# Patient Record
Sex: Female | Born: 1935 | Race: White | Hispanic: No | Smoking: Former smoker
Health system: Southern US, Community
[De-identification: ages and names within clinical notes are randomized; demographics above are authoritative.]

## PROBLEM LIST (undated history)

## (undated) DIAGNOSIS — E7800 Pure hypercholesterolemia, unspecified: Secondary | ICD-10-CM

## (undated) DIAGNOSIS — E119 Type 2 diabetes mellitus without complications: Secondary | ICD-10-CM

## (undated) DIAGNOSIS — I10 Essential (primary) hypertension: Secondary | ICD-10-CM

## (undated) DIAGNOSIS — E079 Disorder of thyroid, unspecified: Secondary | ICD-10-CM

## (undated) DIAGNOSIS — G629 Polyneuropathy, unspecified: Secondary | ICD-10-CM

## (undated) DIAGNOSIS — F419 Anxiety disorder, unspecified: Secondary | ICD-10-CM

## (undated) DIAGNOSIS — C801 Malignant (primary) neoplasm, unspecified: Secondary | ICD-10-CM

## (undated) DIAGNOSIS — E039 Hypothyroidism, unspecified: Secondary | ICD-10-CM

## (undated) HISTORY — PX: APPENDECTOMY: SHX54

## (undated) HISTORY — PX: ABDOMINAL HYSTERECTOMY: SHX81

---

## 1998-11-09 ENCOUNTER — Other Ambulatory Visit: Admission: RE | Admit: 1998-11-09 | Discharge: 1998-11-09 | Payer: Self-pay | Admitting: *Deleted

## 1999-05-22 ENCOUNTER — Other Ambulatory Visit: Admission: RE | Admit: 1999-05-22 | Discharge: 1999-05-22 | Payer: Self-pay | Admitting: *Deleted

## 1999-12-19 ENCOUNTER — Other Ambulatory Visit: Admission: RE | Admit: 1999-12-19 | Discharge: 1999-12-19 | Payer: Self-pay | Admitting: *Deleted

## 1999-12-26 ENCOUNTER — Encounter: Admission: RE | Admit: 1999-12-26 | Discharge: 1999-12-26 | Payer: Self-pay | Admitting: *Deleted

## 2000-07-31 ENCOUNTER — Other Ambulatory Visit: Admission: RE | Admit: 2000-07-31 | Discharge: 2000-07-31 | Payer: Self-pay | Admitting: *Deleted

## 2001-01-06 ENCOUNTER — Encounter: Admission: RE | Admit: 2001-01-06 | Discharge: 2001-01-06 | Payer: Self-pay | Admitting: *Deleted

## 2001-02-25 ENCOUNTER — Other Ambulatory Visit: Admission: RE | Admit: 2001-02-25 | Discharge: 2001-02-25 | Payer: Self-pay | Admitting: *Deleted

## 2001-07-29 ENCOUNTER — Emergency Department (HOSPITAL_COMMUNITY): Admission: EM | Admit: 2001-07-29 | Discharge: 2001-07-29 | Payer: Self-pay | Admitting: Emergency Medicine

## 2001-08-21 ENCOUNTER — Other Ambulatory Visit: Admission: RE | Admit: 2001-08-21 | Discharge: 2001-08-21 | Payer: Self-pay | Admitting: *Deleted

## 2001-10-26 ENCOUNTER — Other Ambulatory Visit: Admission: RE | Admit: 2001-10-26 | Discharge: 2001-10-26 | Payer: Self-pay | Admitting: *Deleted

## 2002-01-19 ENCOUNTER — Encounter: Admission: RE | Admit: 2002-01-19 | Discharge: 2002-01-19 | Payer: Self-pay | Admitting: *Deleted

## 2002-02-26 ENCOUNTER — Other Ambulatory Visit: Admission: RE | Admit: 2002-02-26 | Discharge: 2002-02-26 | Payer: Self-pay | Admitting: *Deleted

## 2002-09-08 ENCOUNTER — Encounter (HOSPITAL_BASED_OUTPATIENT_CLINIC_OR_DEPARTMENT_OTHER): Admission: RE | Admit: 2002-09-08 | Discharge: 2002-12-07 | Payer: Self-pay | Admitting: Internal Medicine

## 2002-09-21 ENCOUNTER — Other Ambulatory Visit: Admission: RE | Admit: 2002-09-21 | Discharge: 2002-09-21 | Payer: Self-pay | Admitting: *Deleted

## 2003-01-27 ENCOUNTER — Encounter (HOSPITAL_BASED_OUTPATIENT_CLINIC_OR_DEPARTMENT_OTHER): Admission: RE | Admit: 2003-01-27 | Discharge: 2003-03-01 | Payer: Self-pay | Admitting: Internal Medicine

## 2003-04-29 ENCOUNTER — Encounter: Admission: RE | Admit: 2003-04-29 | Discharge: 2003-04-29 | Payer: Self-pay | Admitting: *Deleted

## 2003-04-29 ENCOUNTER — Other Ambulatory Visit: Admission: RE | Admit: 2003-04-29 | Discharge: 2003-04-29 | Payer: Self-pay | Admitting: *Deleted

## 2003-06-02 ENCOUNTER — Encounter (HOSPITAL_BASED_OUTPATIENT_CLINIC_OR_DEPARTMENT_OTHER): Admission: RE | Admit: 2003-06-02 | Discharge: 2003-06-21 | Payer: Self-pay | Admitting: Internal Medicine

## 2003-06-14 ENCOUNTER — Encounter: Admission: RE | Admit: 2003-06-14 | Discharge: 2003-09-12 | Payer: Self-pay | Admitting: *Deleted

## 2003-09-26 ENCOUNTER — Encounter (HOSPITAL_BASED_OUTPATIENT_CLINIC_OR_DEPARTMENT_OTHER): Admission: RE | Admit: 2003-09-26 | Discharge: 2003-09-30 | Payer: Self-pay | Admitting: Internal Medicine

## 2003-11-15 ENCOUNTER — Other Ambulatory Visit: Admission: RE | Admit: 2003-11-15 | Discharge: 2003-11-15 | Payer: Self-pay | Admitting: *Deleted

## 2003-12-20 ENCOUNTER — Encounter: Admission: RE | Admit: 2003-12-20 | Discharge: 2004-03-19 | Payer: Self-pay | Admitting: *Deleted

## 2003-12-21 ENCOUNTER — Encounter (HOSPITAL_BASED_OUTPATIENT_CLINIC_OR_DEPARTMENT_OTHER): Admission: RE | Admit: 2003-12-21 | Discharge: 2003-12-29 | Payer: Self-pay | Admitting: Internal Medicine

## 2004-03-29 ENCOUNTER — Encounter (HOSPITAL_BASED_OUTPATIENT_CLINIC_OR_DEPARTMENT_OTHER): Admission: RE | Admit: 2004-03-29 | Discharge: 2004-04-06 | Payer: Self-pay | Admitting: Internal Medicine

## 2004-06-04 ENCOUNTER — Ambulatory Visit: Payer: Self-pay | Admitting: Internal Medicine

## 2004-06-25 ENCOUNTER — Ambulatory Visit: Payer: Self-pay | Admitting: Internal Medicine

## 2004-07-03 ENCOUNTER — Encounter (HOSPITAL_BASED_OUTPATIENT_CLINIC_OR_DEPARTMENT_OTHER): Admission: RE | Admit: 2004-07-03 | Discharge: 2004-07-23 | Payer: Self-pay | Admitting: Internal Medicine

## 2004-10-19 ENCOUNTER — Encounter (HOSPITAL_BASED_OUTPATIENT_CLINIC_OR_DEPARTMENT_OTHER): Admission: RE | Admit: 2004-10-19 | Discharge: 2004-10-26 | Payer: Self-pay | Admitting: Internal Medicine

## 2005-06-27 ENCOUNTER — Ambulatory Visit: Payer: Self-pay | Admitting: Internal Medicine

## 2006-07-30 ENCOUNTER — Emergency Department: Payer: Self-pay | Admitting: Emergency Medicine

## 2006-08-14 ENCOUNTER — Ambulatory Visit: Payer: Self-pay | Admitting: Internal Medicine

## 2007-10-22 ENCOUNTER — Ambulatory Visit: Payer: Self-pay | Admitting: Internal Medicine

## 2008-10-27 ENCOUNTER — Ambulatory Visit: Payer: Self-pay | Admitting: Internal Medicine

## 2009-10-05 ENCOUNTER — Ambulatory Visit: Payer: Self-pay | Admitting: Gastroenterology

## 2009-10-08 ENCOUNTER — Emergency Department: Payer: Self-pay | Admitting: Emergency Medicine

## 2009-11-20 ENCOUNTER — Inpatient Hospital Stay: Payer: Self-pay | Admitting: Surgery

## 2010-01-31 ENCOUNTER — Ambulatory Visit: Payer: Self-pay | Admitting: Internal Medicine

## 2011-07-01 ENCOUNTER — Ambulatory Visit: Payer: Self-pay | Admitting: Internal Medicine

## 2011-09-02 ENCOUNTER — Ambulatory Visit: Payer: Self-pay | Admitting: Gastroenterology

## 2011-09-04 LAB — PATHOLOGY REPORT

## 2012-07-01 ENCOUNTER — Ambulatory Visit: Payer: Self-pay | Admitting: Internal Medicine

## 2012-07-07 ENCOUNTER — Emergency Department: Payer: Self-pay | Admitting: Emergency Medicine

## 2012-07-07 LAB — BASIC METABOLIC PANEL
Anion Gap: 8 (ref 7–16)
BUN: 21 mg/dL — ABNORMAL HIGH (ref 7–18)
Calcium, Total: 8.9 mg/dL (ref 8.5–10.1)
Chloride: 108 mmol/L — ABNORMAL HIGH (ref 98–107)
Co2: 25 mmol/L (ref 21–32)
Creatinine: 0.98 mg/dL (ref 0.60–1.30)
EGFR (African American): >60
EGFR (Non-African Amer.): 55 — ABNORMAL LOW
Glucose: 120 mg/dL — ABNORMAL HIGH (ref 65–99)
Osmolality: 285 (ref 275–301)
Potassium: 4 mmol/L (ref 3.5–5.1)
Sodium: 141 mmol/L (ref 136–145)

## 2012-07-07 LAB — CBC
HCT: 31 % — ABNORMAL LOW (ref 35.0–47.0)
HGB: 10.6 g/dL — ABNORMAL LOW (ref 12.0–16.0)
MCH: 30 pg (ref 26.0–34.0)
MCHC: 34.2 g/dL (ref 32.0–36.0)
MCV: 88 fL (ref 80–100)
Platelet: 199 x10 3/mm 3 (ref 150–440)
RBC: 3.53 X10 6/mm 3 — ABNORMAL LOW (ref 3.80–5.20)
RDW: 13.7 % (ref 11.5–14.5)
WBC: 7.7 x10 3/mm 3 (ref 3.6–11.0)

## 2013-07-02 ENCOUNTER — Ambulatory Visit: Payer: Self-pay | Admitting: Internal Medicine

## 2015-04-01 ENCOUNTER — Emergency Department: Payer: Medicare Other

## 2015-04-01 ENCOUNTER — Emergency Department
Admission: EM | Admit: 2015-04-01 | Discharge: 2015-04-01 | Disposition: A | Discharge status: Home / Self Care | Payer: Medicare Other | Attending: Emergency Medicine | Admitting: Emergency Medicine

## 2015-04-01 ENCOUNTER — Encounter: Payer: Self-pay | Admitting: Emergency Medicine

## 2015-04-01 DIAGNOSIS — S93401A Sprain of unspecified ligament of right ankle, initial encounter: Secondary | ICD-10-CM | POA: Diagnosis not present

## 2015-04-01 DIAGNOSIS — Y9301 Activity, walking, marching and hiking: Secondary | ICD-10-CM | POA: Insufficient documentation

## 2015-04-01 DIAGNOSIS — W01198A Fall on same level from slipping, tripping and stumbling with subsequent striking against other object, initial encounter: Secondary | ICD-10-CM | POA: Diagnosis not present

## 2015-04-01 DIAGNOSIS — S0003XA Contusion of scalp, initial encounter: Secondary | ICD-10-CM | POA: Diagnosis not present

## 2015-04-01 DIAGNOSIS — Y92009 Unspecified place in unspecified non-institutional (private) residence as the place of occurrence of the external cause: Secondary | ICD-10-CM | POA: Insufficient documentation

## 2015-04-01 DIAGNOSIS — I10 Essential (primary) hypertension: Secondary | ICD-10-CM | POA: Diagnosis not present

## 2015-04-01 DIAGNOSIS — Y998 Other external cause status: Secondary | ICD-10-CM | POA: Diagnosis not present

## 2015-04-01 DIAGNOSIS — S99911A Unspecified injury of right ankle, initial encounter: Secondary | ICD-10-CM | POA: Diagnosis present

## 2015-04-01 HISTORY — DX: Disorder of thyroid, unspecified: E07.9

## 2015-04-01 HISTORY — DX: Essential (primary) hypertension: I10

## 2015-04-01 HISTORY — DX: Pure hypercholesterolemia, unspecified: E78.00

## 2015-04-01 HISTORY — DX: Anxiety disorder, unspecified: F41.9

## 2015-04-01 HISTORY — DX: Polyneuropathy, unspecified: G62.9

## 2015-04-01 NOTE — ED Notes (Signed)
Pt's son reports pt tripped over doorway bottom and slid in socks on wooden floor. Pt reports right ankle pain to right side of ankle and top of foot.

## 2015-04-01 NOTE — Discharge Instructions (Signed)
Ankle Sprain An ankle sprain is an injury to the strong, fibrous tissues (ligaments) that hold your ankle bones together.  HOME CARE   Put ice on your ankle for 1-2 days or as told by your doctor.  Put ice in a plastic bag.  Place a towel between your skin and the bag.  Leave the ice on for 15-20 minutes at a time, every 2 hours while you are awake.  Only take medicine as told by your doctor.  Raise (elevate) your injured ankle above the level of your heart as much as possible for 2-3 days.  Use crutches if your doctor tells you to. Slowly put your own weight on the affected ankle. Use the crutches until you can walk without pain.  If you have a plaster splint:  Do not rest it on anything harder than a pillow for 24 hours.  Do not put weight on it.  Do not get it wet.  Take it off to shower or bathe.  If given, use an elastic wrap or support stocking for support. Take the wrap off if your toes lose feeling (numb), tingle, or turn cold or blue.  If you have an air splint:  Add or let out air to make it comfortable.  Take it off at night and to shower and bathe.  Wiggle your toes and move your ankle up and down often while you are wearing it. GET HELP IF:  You have rapidly increasing bruising or puffiness (swelling).  Your toes feel very cold.  You lose feeling in your foot.  Your medicine does not help your pain. GET HELP RIGHT AWAY IF:   Your toes lose feeling (numb) or turn blue.  You have severe pain that is increasing. MAKE SURE YOU:   Understand these instructions.  Will watch your condition.  Will get help right away if you are not doing well or get worse. Document Released: 01/22/2008 Document Revised: 12/20/2013 Document Reviewed: 02/17/2012 [facility] Patient Information 2015 [place], [place]. This information is not intended to replace advice given to you by your health care provider. Make sure you discuss any questions you have with your health care  provider.   WEAR ANKLE SPLINT FOR SUPPORT, ICE AND ELEVATE TYLENOL IF NEEDED FOR PAIN FOLLOW UP WITH DR. Caryl Comes  RETURN TO ER IF ANY URGENT CONCERNS OR SEVERE WORSENING

## 2015-04-01 NOTE — ED Provider Notes (Signed)
[facility] Emergency Department Provider Note  ____________________________________________  Time seen: Approximately 6:17 PM  I have reviewed the triage vital signs and the nursing notes.   HISTORY  Chief Complaint Ankle Pain   HPI Autumn Scott is a 81 y.o. female is here with  complaint of right ankle/foot pain since this morning. She states that she slipped in her sock feet on the floor between the bathroom and kitchen. She states she landed on her bottom but she believes she also hit her head. She denies any loss of consciousness however she lives alone and this was not witnessed. She denies any nausea vomiting, no changes in vision, and family deny altered mental status. Family states that when they arrived she was walking slowly with a limp. She denies any previous injuries to her right ankle. She has not taken any over-the-counter medication for this.   Past Medical History  Diagnosis Date  . Hypertension   . Thyroid disease   . Anxiety   . Neuropathy   . High cholesterol     There are no active problems to display for this patient.   Past Surgical History  Procedure Laterality Date  . Appendectomy    . Abdominal hysterectomy      No current outpatient prescriptions on file.  Allergies Review of patient's allergies indicates no known allergies.  No family history on file.  Social History Social History  Substance Use Topics  . Smoking status: Never Smoker   . Smokeless tobacco: None  . Alcohol Use: No    Review of Systems Constitutional: No fever/chills Eyes: No visual changes. Cardiovascular: Denies chest pain. Respiratory: Denies shortness of breath. Gastrointestinal: No abdominal pain.  No nausea, no vomiting.  Genitourinary: Negative for dysuria. Musculoskeletal: Negative for back pain. Positive for ankle pain Skin: Negative for rash. Neurological: Negative for headaches, focal weakness or numbness.  10-point ROS  otherwise negative.  ____________________________________________   PHYSICAL EXAM:  VITAL SIGNS: ED Triage Vitals  Enc Vitals Group     BP 04/01/15 1647 106/74 mmHg     Pulse Rate 04/01/15 1647 71     Resp 04/01/15 1647 16     Temp 04/01/15 1647 98.3 F (36.8 C)     Temp Source 04/01/15 1647 Oral     SpO2 04/01/15 1647 97 %     Weight 04/01/15 1647 130 lb (58.968 kg)     Height 04/01/15 1647 5\' 3"  (1.6 m)     Head Cir --      Peak Flow --      Pain Score 04/01/15 1648 5     Pain Loc --      Pain Edu? --      Excl. in [place]? --     Constitutional: Alert and oriented. Well appearing and in no acute distress. Eyes: Conjunctivae are normal. PERRL. EOMI. Head: There is some tenderness on palpation of scalp right posterior without evidence of any edema or hematoma. Nose: No congestion/rhinnorhea. Neck: No stridor.  Nontender cervical spine to palpation. Cardiovascular: Normal rate, regular rhythm. Grossly normal heart sounds.  Good peripheral circulation. Respiratory: Normal respiratory effort.  No retractions. Lungs CTAB. Gastrointestinal: Soft and nontender. No distention. Musculoskeletal: Right ankle moderate tenderness lateral aspect with some soft tissue swelling. There is no gross deformity noted. Range of motion is slightly restricted secondary to pain. Patient is able to bear weight. No lower extremity tenderness nor edema.  No joint effusions. Neurologic:  Normal speech and language. No  gross focal neurologic deficits are appreciated. No gait instability. Skin:  Skin is warm, dry and intact. No rash noted. No ecchymosis or abrasions were noted Psychiatric: Mood and affect are normal. Speech and behavior are normal.  ____________________________________________   LABS (all labs ordered are listed, but only abnormal results are displayed)  Labs Reviewed - No data to display   RADIOLOGY  Right ankle x-ray per radiologist and reviewed by me showed no acute fracture. Mild  soft tissue swelling was seen. I, Johnn Hai, personally viewed and evaluated these images as part of my medical decision making.    CT per radiologist showed no acute intracranial abnormality ____________________________________________   PROCEDURES  Procedure(s) performed: None  Critical Care performed: No  ____________________________________________   INITIAL IMPRESSION / ASSESSMENT AND PLAN / ED COURSE  Pertinent labs & imaging results that were available during my care of the patient were reviewed by me and considered in my medical decision making (see chart for details  patient was placed in a stirrup ankle splint to see if this is her support with walking. Patient were made with Tylenol for pain if needed. It was discussed that other narcotics couldn't cause drowsiness increase her risk of falling. Family members were making arrangements for someone to stay with her. Patient is to follow-up with her PCP if any continued problems. She will ice and elevate as needed for swelling and pain. She'll also return to the emergency room if any severe worsening or urgent concerns.   FINAL CLINICAL IMPRESSION(S) / ED DIAGNOSES  Final diagnoses:  Sprain of right ankle, initial encounter  Contusion of scalp, initial encounter      Johnn Hai, PA-C 04/01/15 2031  Orbie Pyo, MD 04/02/15 [phone]

## 2015-04-01 NOTE — ED Notes (Signed)
Pt to ed with c/o right ankle/foot pain and hit her head when she fell at home. Pt lives alone and called family, daughter states she was still walking on her foot when it happened. Pt states she was getting out of the bathroom and slipped and landed on her bottom

## 2015-07-06 ENCOUNTER — Encounter: Payer: Self-pay | Admitting: Emergency Medicine

## 2015-07-06 ENCOUNTER — Emergency Department: Payer: Medicare Other

## 2015-07-06 ENCOUNTER — Emergency Department
Admission: EM | Admit: 2015-07-06 | Discharge: 2015-07-06 | Disposition: A | Discharge status: Home / Self Care | Payer: Medicare Other | Attending: Student | Admitting: Student

## 2015-07-06 DIAGNOSIS — R001 Bradycardia, unspecified: Secondary | ICD-10-CM

## 2015-07-06 DIAGNOSIS — E860 Dehydration: Secondary | ICD-10-CM | POA: Insufficient documentation

## 2015-07-06 DIAGNOSIS — R42 Dizziness and giddiness: Secondary | ICD-10-CM | POA: Diagnosis present

## 2015-07-06 DIAGNOSIS — Z79899 Other long term (current) drug therapy: Secondary | ICD-10-CM | POA: Diagnosis not present

## 2015-07-06 DIAGNOSIS — N390 Urinary tract infection, site not specified: Secondary | ICD-10-CM | POA: Diagnosis not present

## 2015-07-06 DIAGNOSIS — I10 Essential (primary) hypertension: Secondary | ICD-10-CM | POA: Diagnosis not present

## 2015-07-06 LAB — URINALYSIS COMPLETE WITH MICROSCOPIC (ARMC ONLY)
BACTERIA UA: NONE SEEN
Bilirubin Urine: NEGATIVE
Glucose, UA: NEGATIVE mg/dL
Hgb urine dipstick: NEGATIVE
Nitrite: NEGATIVE
PH: 5 (ref 5.0–8.0)
PROTEIN: NEGATIVE mg/dL
Specific Gravity, Urine: 1.021 (ref 1.005–1.030)

## 2015-07-06 LAB — CBC WITH DIFFERENTIAL/PLATELET
BASOS PCT: 1 %
Basophils Absolute: 0.1 10*3/uL (ref 0–0.1)
EOS PCT: 2 %
Eosinophils Absolute: 0.1 10*3/uL (ref 0–0.7)
HEMATOCRIT: 36.6 % (ref 35.0–47.0)
Hemoglobin: 12.4 g/dL (ref 12.0–16.0)
Lymphocytes Relative: 21 %
Lymphs Abs: 1.2 10*3/uL (ref 1.0–3.6)
MCH: 29.3 pg (ref 26.0–34.0)
MCHC: 33.8 g/dL (ref 32.0–36.0)
MCV: 86.8 fL (ref 80.0–100.0)
MONO ABS: 0.5 10*3/uL (ref 0.2–0.9)
MONOS PCT: 9 %
NEUTROS ABS: 3.7 10*3/uL (ref 1.4–6.5)
Neutrophils Relative %: 67 %
PLATELETS: 226 10*3/uL (ref 150–440)
RBC: 4.22 MIL/uL (ref 3.80–5.20)
RDW: 13.9 % (ref 11.5–14.5)
WBC: 5.5 10*3/uL (ref 3.6–11.0)

## 2015-07-06 LAB — TROPONIN I

## 2015-07-06 LAB — BASIC METABOLIC PANEL
ANION GAP: 8 (ref 5–15)
BUN: 27 mg/dL — ABNORMAL HIGH (ref 6–20)
CALCIUM: 9.9 mg/dL (ref 8.9–10.3)
CO2: 26 mmol/L (ref 22–32)
Chloride: 103 mmol/L (ref 101–111)
Creatinine, Ser: 1.21 mg/dL — ABNORMAL HIGH (ref 0.44–1.00)
GFR, EST AFRICAN AMERICAN: 47 mL/min — AB (ref >60)
GFR, EST NON AFRICAN AMERICAN: 41 mL/min — AB (ref >60)
Glucose, Bld: 105 mg/dL — ABNORMAL HIGH (ref 65–99)
Potassium: 3.9 mmol/L (ref 3.5–5.1)
SODIUM: 137 mmol/L (ref 135–145)

## 2015-07-06 MED ORDER — SODIUM CHLORIDE 0.9 % IV BOLUS (SEPSIS)
500.0000 mL | Freq: Once | INTRAVENOUS | Status: AC
  Administered 2015-07-06: 500 mL via INTRAVENOUS

## 2015-07-06 MED ORDER — CEPHALEXIN 500 MG PO CAPS: 500.0000 mg | ORAL_CAPSULE | Freq: Four times a day (QID) | ORAL | Status: AC

## 2015-07-06 MED ORDER — DEXTROSE 5 % IV SOLN
1.0000 g | Freq: Once | INTRAVENOUS | Status: AC
  Administered 2015-07-06: 1 g via INTRAVENOUS
  Filled 2015-07-06: qty 10

## 2015-07-06 NOTE — ED Notes (Signed)
Lives alone , states has been in bed all day, c/o some nausea and some nausea, yesterday was her normal, denies gait problems,denies headache, she drove to her friends house to get her to bring her to the er, friend states she sees no change in pt

## 2015-07-06 NOTE — ED Provider Notes (Signed)
-----------------------------------------   5:02 PM on 07/06/2015 -----------------------------------------  Care was assumed from Dr. Burlene Arnt pending reassessment. Patient reports she feels well and is requesting discharge. Heart rate in the 60s, blood pressure with a systolic of 0000000. She is ambulating without any difficulty, lightheadedness, dizziness. I discussed with her return precautions, need for close follow-up tomorrow with Dr. Humphrey Rolls and she is comfortable with discharge.  Joanne Gavel, MD 07/06/15 [phone]

## 2015-07-06 NOTE — ED Notes (Signed)
MD at bedside. 

## 2015-07-06 NOTE — ED Notes (Signed)
Pt ambulated about 50 feet. Pt denied dizziness during walk. Pt was steady on her feet.  Pt reports feeling comfortable with discharge

## 2015-07-06 NOTE — ED Notes (Signed)
Patient transported to CT 

## 2015-07-06 NOTE — ED Notes (Signed)
Pt to ed with c/o dizziness that started about 0830.  Pt also reports weakness all over that started about the same time. Pt also reports some mild confusion this am.

## 2015-07-06 NOTE — ED Provider Notes (Addendum)
[facility] Emergency Department Provider Note  ____________________________________________   I have reviewed the triage vital signs and the nursing notes.   HISTORY  Chief Complaint Dizziness    HPI Autumn Scott is a 81 y.o. female presents today after having felt lightheaded earlier today. Patient states that she was in her normal state of health and she was lying down to sit up in her eye and felt lightheaded. Apparently saw another provider she had a headache, she denied this to me. She states she just felt lightheaded for little while walked to her neighbor's house and by the time she got there she felt fine. She did not have any true vertigo. She had no focal numbness or weakness. She did not pass out. She'll feel that she was going to pass out. She had no abdominal pain no rectal bleeding no melena no vomiting no chest pain no headache, at this time she complains of nothing at all.  Past Medical History  Diagnosis Date  . Hypertension   . Thyroid disease   . Anxiety   . Neuropathy ([place])   . High cholesterol     There are no active problems to display for this patient.   Past Surgical History  Procedure Laterality Date  . Appendectomy    . Abdominal hysterectomy      Current Outpatient Rx  Name  Route  Sig  Dispense  Refill  . gabapentin (NEURONTIN) 600 MG tablet   Oral   Take 600 mg by mouth every 6 (six) hours.         Marland Kitchen levothyroxine (SYNTHROID, LEVOTHROID) 100 MCG tablet   Oral   Take 100 mcg by mouth daily before breakfast.         . lisinopril (PRINIVIL,ZESTRIL) 10 MG tablet   Oral   Take 10 mg by mouth daily.         . simvastatin (ZOCOR) 40 MG tablet   Oral   Take 40 mg by mouth daily at 6 PM.           Allergies Review of patient's allergies indicates no known allergies.  History reviewed. No pertinent family history.  Social History Social History  Substance Use Topics  . Smoking status: Never Smoker    . Smokeless tobacco: None  . Alcohol Use: No    Review of Systems Constitutional: No fever/chills Eyes: No visual changes. ENT: No sore throat. No stiff neck no neck pain Cardiovascular: Denies chest pain. Respiratory: Denies shortness of breath. Gastrointestinal:   no vomiting.  No diarrhea.  No constipation. Genitourinary: Negative for dysuria. Musculoskeletal: Negative lower extremity swelling Skin: Negative for rash. Neurological: Negative for headaches, focal weakness or numbness. 10-point ROS otherwise negative.  ____________________________________________   PHYSICAL EXAM:  VITAL SIGNS: ED Triage Vitals  Enc Vitals Group     BP 07/06/15 1304 121/62 mmHg     Pulse Rate 07/06/15 1304 69     Resp 07/06/15 1304 20     Temp 07/06/15 1304 97.4 F (36.3 C)     Temp Source 07/06/15 1304 Oral     SpO2 07/06/15 1304 96 %     Weight 07/06/15 1304 130 lb (58.968 kg)     Height 07/06/15 1304 5\' 5"  (1.651 m)     Head Cir --      Peak Flow --      Pain Score 07/06/15 1305 0     Pain Loc --      Pain Edu? --  Excl. in [place]? --     Constitutional: Alert and oriented. Well appearing and in no acute distress. Eyes: Conjunctivae are normal. PERRL. EOMI. Head: Atraumatic. Nose: No congestion/rhinnorhea. Mouth/Throat: Mucous membranes are moist.  Oropharynx non-erythematous. Neck: No stridor.   Nontender with no meningismus Cardiovascular: Normal rate, regular rhythm. Grossly normal heart sounds.  Good peripheral circulation. Respiratory: Normal respiratory effort.  No retractions. Lungs CTAB. Abdominal: Soft and nontender. No distention. No guarding no rebound Back:  There is no focal tenderness or step off there is no midline tenderness there are no lesions noted. there is no CVA tenderness Musculoskeletal: No lower extremity tenderness. No joint effusions, no DVT signs strong distal pulses no edema Neurologic:  Cranial nerves II through XII are grossly intact 5 out of 5  strength bilateral upper and lower extremity. Finger to nose within normal limits heel to shin within normal limits, speech is normal with no word finding difficulty ordered dysarthria, reflexes symmetric pupils are equally round and reactive to light there is no pronator drift, sensation is normal, normal neurologic exam Skin:  Skin is warm, dry and intact. No rash noted. Psychiatric: Mood and affect are normal. Speech and behavior are normal.  ____________________________________________   LABS (all labs ordered are listed, but only abnormal results are displayed)  Labs Reviewed  BASIC METABOLIC PANEL - Abnormal; Notable for the following:    Glucose, Bld 105 (*)    BUN 27 (*)    Creatinine, Ser 1.21 (*)    GFR calc non Af Amer 41 (*)    GFR calc Af Amer 47 (*)    All other components within normal limits  CBC WITH DIFFERENTIAL/PLATELET  TROPONIN I  URINALYSIS COMPLETEWITH MICROSCOPIC ([facility] ONLY)   ____________________________________________  EKG  I personally interpreted any EKGs ordered by me or triage Sinus bradycardia rate 55 bpm no acute ST elevation or depression poor R-wave progression noted, no acute ischemic changes ____________________________________________  RADIOLOGY  I reviewed any imaging ordered by me or triage that were performed during my shift ____________________________________________   PROCEDURES  Procedure(s) performed: None  Critical Care performed: None  ____________________________________________   INITIAL IMPRESSION / ASSESSMENT AND PLAN / ED COURSE  Pertinent labs & imaging results that were available during my care of the patient were reviewed by me and considered in my medical decision making (see chart for details).  Very well-appearing woman here with no complaints, states that she was lightheaded when standing up rapidly at home. Mild bradycardia noted but not dramatic in terms of pressure. Even her H Willis to a large workup but  if negative I hope that we can get her safely home. This time we are waiting for her urinalysis.   ----------------------------------------- 3:47 PM on 07/06/2015 -----------------------------------------  UA as noted, patient has no dysuria no fever we'll start her on antibiotics cultures. This could make her lightheaded.. She does appear mildly dehydrated by her blood work within slightly elevated BUN/creatinine ratio which we'll giving her IV fluid 4. Patient is not on any beta blocker blood pressure medication and her heart rate is in the 50s muscle time and sometimes goes down as low as 48. She is not orthostatic in terms of her response to standing up from a lying position but her blood pressure does drop some. I will discuss with cardiology her bradycardia, there is certainly no evidence of ischemia and her symptoms began this morning. Patient is adamant that she would prefer to go home, we will give her a  liter of fluid and recheck her orthostatics and she remains a symptomatically this and cardiology can follow up in short order we may be able to arrange that.  D/w Dr. Humphrey Rolls, cardiology, and he agrees that discharge with this heart rate is not a problem he will see her first thing tomorrow morning at 9. We will see what her orthostatics are after IV fluid and reassess. Dr. Edd Fabian to f/u.   ____________________________________________   FINAL CLINICAL IMPRESSION(S) / ED DIAGNOSES  Final diagnoses:  None     Schuyler Amor, MD 07/06/15 [place], MD 07/06/15 [place], MD 07/06/15 [place], MD 07/06/15 [place], MD 07/06/15 [phone]

## 2015-07-08 LAB — URINE CULTURE

## 2016-02-14 ENCOUNTER — Other Ambulatory Visit: Payer: Self-pay | Admitting: Internal Medicine

## 2016-02-14 DIAGNOSIS — R413 Other amnesia: Secondary | ICD-10-CM

## 2016-02-22 ENCOUNTER — Ambulatory Visit: Payer: Medicare Other | Attending: Internal Medicine

## 2016-08-26 ENCOUNTER — Emergency Department: Payer: Medicare Other

## 2016-08-26 ENCOUNTER — Inpatient Hospital Stay
Admission: EM | Admit: 2016-08-26 | Discharge: 2016-08-29 | DRG: 956 | Disposition: A | Discharge status: Skilled Nursing Facility | Payer: Medicare Other | Attending: Internal Medicine | Admitting: Internal Medicine

## 2016-08-26 DIAGNOSIS — Y92009 Unspecified place in unspecified non-institutional (private) residence as the place of occurrence of the external cause: Secondary | ICD-10-CM | POA: Diagnosis not present

## 2016-08-26 DIAGNOSIS — E039 Hypothyroidism, unspecified: Secondary | ICD-10-CM | POA: Diagnosis present

## 2016-08-26 DIAGNOSIS — I129 Hypertensive chronic kidney disease with stage 1 through stage 4 chronic kidney disease, or unspecified chronic kidney disease: Secondary | ICD-10-CM | POA: Diagnosis present

## 2016-08-26 DIAGNOSIS — Z90710 Acquired absence of both cervix and uterus: Secondary | ICD-10-CM | POA: Diagnosis not present

## 2016-08-26 DIAGNOSIS — M25551 Pain in right hip: Secondary | ICD-10-CM

## 2016-08-26 DIAGNOSIS — F419 Anxiety disorder, unspecified: Secondary | ICD-10-CM | POA: Diagnosis present

## 2016-08-26 DIAGNOSIS — W1830XA Fall on same level, unspecified, initial encounter: Secondary | ICD-10-CM | POA: Diagnosis present

## 2016-08-26 DIAGNOSIS — F0390 Unspecified dementia without behavioral disturbance: Secondary | ICD-10-CM | POA: Diagnosis present

## 2016-08-26 DIAGNOSIS — Z833 Family history of diabetes mellitus: Secondary | ICD-10-CM

## 2016-08-26 DIAGNOSIS — S72141A Displaced intertrochanteric fracture of right femur, initial encounter for closed fracture: Secondary | ICD-10-CM | POA: Diagnosis present

## 2016-08-26 DIAGNOSIS — M6281 Muscle weakness (generalized): Secondary | ICD-10-CM

## 2016-08-26 DIAGNOSIS — Z79899 Other long term (current) drug therapy: Secondary | ICD-10-CM

## 2016-08-26 DIAGNOSIS — S32591A Other specified fracture of right pubis, initial encounter for closed fracture: Secondary | ICD-10-CM | POA: Diagnosis present

## 2016-08-26 DIAGNOSIS — Z8249 Family history of ischemic heart disease and other diseases of the circulatory system: Secondary | ICD-10-CM | POA: Diagnosis not present

## 2016-08-26 DIAGNOSIS — E7800 Pure hypercholesterolemia, unspecified: Secondary | ICD-10-CM | POA: Diagnosis present

## 2016-08-26 DIAGNOSIS — E1122 Type 2 diabetes mellitus with diabetic chronic kidney disease: Secondary | ICD-10-CM | POA: Diagnosis present

## 2016-08-26 DIAGNOSIS — Z808 Family history of malignant neoplasm of other organs or systems: Secondary | ICD-10-CM | POA: Diagnosis not present

## 2016-08-26 DIAGNOSIS — Z419 Encounter for procedure for purposes other than remedying health state, unspecified: Secondary | ICD-10-CM

## 2016-08-26 DIAGNOSIS — N189 Chronic kidney disease, unspecified: Secondary | ICD-10-CM | POA: Diagnosis present

## 2016-08-26 DIAGNOSIS — N390 Urinary tract infection, site not specified: Secondary | ICD-10-CM | POA: Diagnosis present

## 2016-08-26 DIAGNOSIS — E785 Hyperlipidemia, unspecified: Secondary | ICD-10-CM | POA: Diagnosis present

## 2016-08-26 DIAGNOSIS — N179 Acute kidney failure, unspecified: Secondary | ICD-10-CM | POA: Diagnosis present

## 2016-08-26 DIAGNOSIS — R262 Difficulty in walking, not elsewhere classified: Secondary | ICD-10-CM

## 2016-08-26 DIAGNOSIS — Z8781 Personal history of (healed) traumatic fracture: Secondary | ICD-10-CM

## 2016-08-26 DIAGNOSIS — Z9889 Other specified postprocedural states: Secondary | ICD-10-CM

## 2016-08-26 HISTORY — DX: Malignant (primary) neoplasm, unspecified: C80.1

## 2016-08-26 HISTORY — DX: Hypothyroidism, unspecified: E03.9

## 2016-08-26 LAB — GLUCOSE, CAPILLARY
GLUCOSE-CAPILLARY: 303 mg/dL — AB (ref 65–99)
Glucose-Capillary: 374 mg/dL — ABNORMAL HIGH (ref 65–99)

## 2016-08-26 LAB — CBC WITH DIFFERENTIAL/PLATELET
BASOS ABS: 0.1 10*3/uL (ref 0–0.1)
Basophils Relative: 1 %
Eosinophils Absolute: 0.2 10*3/uL (ref 0–0.7)
Eosinophils Relative: 2 %
HEMATOCRIT: 33.8 % — AB (ref 35.0–47.0)
Hemoglobin: 11.6 g/dL — ABNORMAL LOW (ref 12.0–16.0)
LYMPHS PCT: 6 %
Lymphs Abs: 0.6 10*3/uL — ABNORMAL LOW (ref 1.0–3.6)
MCH: 30.2 pg (ref 26.0–34.0)
MCHC: 34.4 g/dL (ref 32.0–36.0)
MCV: 88 fL (ref 80.0–100.0)
MONO ABS: 0.9 10*3/uL (ref 0.2–0.9)
MONOS PCT: 8 %
NEUTROS ABS: 9 10*3/uL — AB (ref 1.4–6.5)
Neutrophils Relative %: 83 %
Platelets: 283 10*3/uL (ref 150–440)
RBC: 3.84 MIL/uL (ref 3.80–5.20)
RDW: 15.1 % — AB (ref 11.5–14.5)
WBC: 10.8 10*3/uL (ref 3.6–11.0)

## 2016-08-26 LAB — COMPREHENSIVE METABOLIC PANEL
ALT: 22 U/L (ref 14–54)
AST: 27 U/L (ref 15–41)
Albumin: 4 g/dL (ref 3.5–5.0)
Alkaline Phosphatase: 51 U/L (ref 38–126)
Anion gap: 7 (ref 5–15)
BILIRUBIN TOTAL: 1 mg/dL (ref 0.3–1.2)
BUN: 43 mg/dL — AB (ref 6–20)
CO2: 29 mmol/L (ref 22–32)
CREATININE: 1.25 mg/dL — AB (ref 0.44–1.00)
Calcium: 10.1 mg/dL (ref 8.9–10.3)
Chloride: 102 mmol/L (ref 101–111)
GFR calc Af Amer: 45 mL/min — ABNORMAL LOW (ref >60)
GFR, EST NON AFRICAN AMERICAN: 39 mL/min — AB (ref >60)
Glucose, Bld: 433 mg/dL — ABNORMAL HIGH (ref 65–99)
Potassium: 3.5 mmol/L (ref 3.5–5.1)
Sodium: 138 mmol/L (ref 135–145)
TOTAL PROTEIN: 7.8 g/dL (ref 6.5–8.1)

## 2016-08-26 MED ORDER — OXYCODONE HCL 5 MG PO TABS: 5.0000 mg | ORAL_TABLET | ORAL | Status: DC | PRN

## 2016-08-26 MED ORDER — HYDROCODONE-ACETAMINOPHEN 5-325 MG PO TABS: 1.0000 | ORAL_TABLET | Freq: Four times a day (QID) | ORAL | Status: DC | PRN

## 2016-08-26 MED ORDER — LEVOTHYROXINE SODIUM 100 MCG PO TABS
100.0000 ug | ORAL_TABLET | Freq: Every day | ORAL | Status: DC
  Administered 2016-08-28 – 2016-08-29 (×2): 100 ug via ORAL
  Filled 2016-08-26 (×2): qty 1

## 2016-08-26 MED ORDER — DEXTROSE 5 % IV SOLN
500.0000 mg | Freq: Four times a day (QID) | INTRAVENOUS | Status: DC | PRN
  Filled 2016-08-26: qty 5

## 2016-08-26 MED ORDER — MORPHINE SULFATE (PF) 2 MG/ML IV SOLN: 0.5000 mg | INTRAVENOUS | Status: DC | PRN

## 2016-08-26 MED ORDER — BISACODYL 5 MG PO TBEC: 5.0000 mg | DELAYED_RELEASE_TABLET | Freq: Every day | ORAL | Status: DC | PRN

## 2016-08-26 MED ORDER — METHOCARBAMOL 500 MG PO TABS
500.0000 mg | ORAL_TABLET | Freq: Four times a day (QID) | ORAL | Status: DC | PRN
  Filled 2016-08-26: qty 1

## 2016-08-26 MED ORDER — LISINOPRIL 10 MG PO TABS
10.0000 mg | ORAL_TABLET | Freq: Every day | ORAL | Status: DC
  Administered 2016-08-28 – 2016-08-29 (×2): 10 mg via ORAL
  Filled 2016-08-26 (×2): qty 1

## 2016-08-26 MED ORDER — SENNOSIDES-DOCUSATE SODIUM 8.6-50 MG PO TABS: 1.0000 | ORAL_TABLET | Freq: Every evening | ORAL | Status: DC | PRN

## 2016-08-26 MED ORDER — MAGNESIUM CITRATE PO SOLN
1.0000 | Freq: Once | ORAL | Status: DC | PRN
  Filled 2016-08-26: qty 296

## 2016-08-26 MED ORDER — GABAPENTIN 600 MG PO TABS
600.0000 mg | ORAL_TABLET | Freq: Four times a day (QID) | ORAL | Status: DC
  Administered 2016-08-27 – 2016-08-29 (×5): 600 mg via ORAL
  Filled 2016-08-26 (×6): qty 1

## 2016-08-26 MED ORDER — MORPHINE SULFATE (PF) 2 MG/ML IV SOLN: 2.0000 mg | INTRAVENOUS | Status: DC | PRN

## 2016-08-26 MED ORDER — HEPARIN SODIUM (PORCINE) 5000 UNIT/ML IJ SOLN: 5000.0000 [IU] | Freq: Three times a day (TID) | INTRAMUSCULAR | Status: DC

## 2016-08-26 MED ORDER — CEFAZOLIN SODIUM-DEXTROSE 2-4 GM/100ML-% IV SOLN
2.0000 g | INTRAVENOUS | Status: AC
  Administered 2016-08-27: 2 g via INTRAVENOUS
  Filled 2016-08-26: qty 100

## 2016-08-26 MED ORDER — SIMVASTATIN 20 MG PO TABS
40.0000 mg | ORAL_TABLET | Freq: Every day | ORAL | Status: DC
  Administered 2016-08-27 – 2016-08-28 (×2): 40 mg via ORAL
  Filled 2016-08-26 (×2): qty 2

## 2016-08-26 MED ORDER — INSULIN ASPART 100 UNIT/ML ~~LOC~~ SOLN
0.0000 [IU] | Freq: Every day | SUBCUTANEOUS | Status: DC
  Administered 2016-08-27: 4 [IU] via SUBCUTANEOUS
  Filled 2016-08-26: qty 4

## 2016-08-26 NOTE — ED Notes (Signed)
Pt arrived via ems for c/o fall on Friday - at time of fall pt had no complaints per family but over the last 24 hours has started to c/o right hip pain - 2/10 when sitting and 7/10 when manipulated /touched - pt cbg by ems was 410 and recheck in ed was 374 - pt has NO history of diabetes - pt is A&O to baseline per family

## 2016-08-26 NOTE — H&P (Signed)
History and Physical   [facility] - [place] @ [facility] Admission History and Physical [employer], D.O.    Patient Name: Autumn Scott MR#: [id number] Date of Birth: 1931-10-22 Date of Admission: 08/26/2016  Referring MD/NP/PA: Dr. Quentin Cornwall Primary Care Physician: Adin Hector, MD  Chief Complaint: Fall Please note the patient's history is limited by dementia. The entire history is obtained from the patient's chart as family is unavailable at this time.  HPI: Autumn Scott is a 83 y.o. female with a known history of hypertension, hyperlipidemia, diabetes, anemia, dementia was in a usual state of health until sometime this week when she sits sustained an unwitnessed fall. He came to the emergency department today complaining of right-sided hip pain. Patient cannot recall any of the details surrounding the fall. At present she complains only of right leg and hip pain with movement.  The patient's baseline functional capacity is unclear at this time.  Patient denies fevers/chills, weakness, dizziness, chest pain, shortness of breath, N/V/C/D, abdominal pain, dysuria/frequency.  Review of Systems:  CONSTITUTIONAL: No fever/chills, fatigue, weakness, weight gain/loss, headache. EYES: No blurry or double vision. ENT: No tinnitus, postnasal drip, redness or soreness of the oropharynx. RESPIRATORY: No cough, dyspnea, wheeze.  No hemoptysis.  CARDIOVASCULAR: No chest pain, palpitations, syncope, orthopnea. No lower extremity edema.  GASTROINTESTINAL: No nausea, vomiting, abdominal pain, diarrhea, constipation.  No hematemesis, melena or hematochezia. GENITOURINARY: No dysuria, frequency, hematuria. ENDOCRINE: No polyuria or nocturia. No heat or cold intolerance. HEMATOLOGY: No anemia, bruising, bleeding. INTEGUMENTARY: No rashes, ulcers, lesions. MUSCULOSKELETAL: No arthritis, gout, dyspnea. Positive right hip and leg pain with movement NEUROLOGIC: No numbness, tingling,  ataxia, seizure-type activity, weakness. PSYCHIATRIC: No anxiety, depression, insomnia.   Past Medical History:  Diagnosis Date  . Anxiety   . High cholesterol   . Hypertension   . Neuropathy ([place])   . Thyroid disease     Past Surgical History:  Procedure Laterality Date  . ABDOMINAL HYSTERECTOMY    . APPENDECTOMY       reports that she has never smoked. She has never used smokeless tobacco. She reports that she does not drink alcohol or use drugs.  No Known Allergies  Father with diabetes and MI. Mother with brain cancer  Prior to Admission medications   Medication Sig Start Date End Date Taking? Authorizing Provider  gabapentin (NEURONTIN) 600 MG tablet Take 600 mg by mouth every 6 (six) hours.   Yes Historical Provider, MD  levothyroxine (SYNTHROID, LEVOTHROID) 100 MCG tablet Take 100 mcg by mouth daily before breakfast.   Yes Historical Provider, MD  lisinopril (PRINIVIL,ZESTRIL) 10 MG tablet Take 10 mg by mouth daily.   Yes Historical Provider, MD  simvastatin (ZOCOR) 40 MG tablet Take 40 mg by mouth daily at 6 PM.   Yes Historical Provider, MD    Physical Exam: Vitals:   08/26/16 1947 08/26/16 2000 08/26/16 2030 08/26/16 2100  BP:  131/61 (!) 148/64 (!) 148/68  Pulse:  72 65 69  Resp:    (!) 4  Temp:      TempSrc:      SpO2:  100% 97% 99%  Weight: 63.5 kg (140 lb)     Height: 5\' 5"  (1.651 m)       GENERAL: 83 y.o.-year-old Frail white female patient, well-developed, well-nourished lying in the bed in no acute distress.  Pleasantly confused. HEENT: Head atraumatic, normocephalic. Pupils equal, round, reactive to light and accommodation. No scleral icterus. Extraocular muscles intact. Nares are patent.  Oropharynx is clear. Mucus membranes moist. NECK: Supple, full range of motion. No JVD, no bruit heard. No thyroid enlargement, no tenderness, no cervical lymphadenopathy. CHEST: Normal breath sounds bilaterally. No wheezing, rales, rhonchi or crackles. No use of  accessory muscles of respiration.  No reproducible chest wall tenderness.  CARDIOVASCULAR: S1, S2 normal. No murmurs, rubs, or gallops. Cap refill <2 seconds. Pulses intact distally.  ABDOMEN: Soft, nondistended, nontender. No rebound, guarding, rigidity. Normoactive bowel sounds present in all four quadrants. No organomegaly or mass. EXTREMITIES: Positive right hip tenderness to palpation. Severe pain with mild movement of the right lower extremity. No pedal edema, cyanosis, or clubbing. No calf tenderness or Homan's sign.  NEUROLOGIC: The patient is alert and oriented x 1. Cranial nerves II through XII are grossly intact with no focal sensorimotor deficit. Muscle strength 5/5 in all extremities. Sensation intact. Gait not checked. SKIN: Warm, dry, and intact without obvious rash, lesion, or ulcer.    Labs on Admission:  CBC:  Recent Labs Lab 08/26/16 1954  WBC 10.8  NEUTROABS 9.0*  HGB 11.6*  HCT 33.8*  MCV 88.0  PLT [id number]   Basic Metabolic Panel:  Recent Labs Lab 08/26/16 1954  NA 138  K 3.5  CL 102  CO2 29  GLUCOSE 433*  BUN 43*  CREATININE 1.25*  CALCIUM 10.1   GFR: Estimated Creatinine Clearance: 31.2 mL/min (by C-G formula based on SCr of 1.25 mg/dL (H)). Liver Function Tests:  Recent Labs Lab 08/26/16 1954  AST 27  ALT 22  ALKPHOS 51  BILITOT 1.0  PROT 7.8  ALBUMIN 4.0   No results for input(s): LIPASE, AMYLASE in the last 168 hours. No results for input(s): AMMONIA in the last 168 hours. Coagulation Profile: No results for input(s): INR, PROTIME in the last 168 hours. Cardiac Enzymes: No results for input(s): CKTOTAL, CKMB, CKMBINDEX, TROPONINI in the last 168 hours. BNP (last 3 results) No results for input(s): PROBNP in the last 8760 hours. HbA1C: No results for input(s): HGBA1C in the last 72 hours. CBG:  Recent Labs Lab 08/26/16 1941  GLUCAP 374*   Lipid Profile: No results for input(s): CHOL, HDL, LDLCALC, TRIG, CHOLHDL, LDLDIRECT in the  last 72 hours. Thyroid Function Tests: No results for input(s): TSH, T4TOTAL, FREET4, T3FREE, THYROIDAB in the last 72 hours. Anemia Panel: No results for input(s): VITAMINB12, FOLATE, FERRITIN, TIBC, IRON, RETICCTPCT in the last 72 hours. Urine analysis:    Component Value Date/Time   COLORURINE YELLOW (A) 07/06/2015 1440   APPEARANCEUR CLEAR (A) 07/06/2015 1440   LABSPEC 1.021 07/06/2015 1440   PHURINE 5.0 07/06/2015 1440   GLUCOSEU NEGATIVE 07/06/2015 1440   HGBUR NEGATIVE 07/06/2015 1440   BILIRUBINUR NEGATIVE 07/06/2015 1440   KETONESUR 1+ (A) 07/06/2015 1440   PROTEINUR NEGATIVE 07/06/2015 1440   NITRITE NEGATIVE 07/06/2015 1440   LEUKOCYTESUR 2+ (A) 07/06/2015 1440   Sepsis Labs: @LABRCNTIP (procalcitonin:4,lacticidven:4) )No results found for this or any previous visit (from the past 240 hour(s)).   Radiological Exams on Admission: Ct Head Wo Contrast  Result Date: 08/26/2016 CLINICAL DATA:  Status post fall, with concern for head injury. Initial encounter. EXAM: CT HEAD WITHOUT CONTRAST TECHNIQUE: Contiguous axial images were obtained from the base of the skull through the vertex without intravenous contrast. COMPARISON:  CT of the head performed 07/06/2015 FINDINGS: Brain: No evidence of acute infarction, hemorrhage, hydrocephalus, extra-axial collection or mass lesion/mass effect. Prominence of the ventricles and sulci reflects moderate cortical volume loss. Mild cerebellar atrophy is noted.  Scattered periventricular and subcortical white matter change likely reflects small vessel ischemic microangiopathy. The brainstem and fourth ventricle are within normal limits. The basal ganglia are unremarkable in appearance. The cerebral hemispheres demonstrate grossly normal gray-white differentiation. No mass effect or midline shift is seen. Vascular: No hyperdense vessel or unexpected calcification. Skull: There is no evidence of fracture; visualized osseous structures are unremarkable  in appearance. Sinuses/Orbits: The visualized portions of the orbits are within normal limits. There is partial opacification of the right mastoid air cells and the middle ear. Would correlate for evidence of otitis media. The paranasal sinuses and left mastoid air cells are well-aerated. Other: No significant soft tissue abnormalities are seen. IMPRESSION: 1. No evidence of traumatic intracranial injury or fracture. 2. Moderate cortical volume loss and scattered small vessel ischemic microangiopathy. 3. Partial opacification of the right mastoid air cells and the middle ear. Would correlate for any evidence of right-sided otitis media. Electronically Signed   By: Garald Balding M.D.   On: 08/26/2016 20:31   Dg Hip Unilat With Pelvis 2-3 Views Right  Result Date: 08/26/2016 CLINICAL DATA:  Initial evaluation for acute right hip pain status post recent fall. EXAM: DG HIP (WITH OR WITHOUT PELVIS) 2-3V RIGHT COMPARISON:  None. FINDINGS: There is an acute nondisplaced fracture through the intertrochanteric right hip. Right femoral head remains normally aligned within the acetabulum. Femoral head height preserved. Additional comminuted fractures involving the right superior pubic ramus with extension towards the pubis symphysis. Additional nondisplaced fracture through the right inferior pubic ramus. On AP projection, there is alternatively, this may reflect an overlying skin fold. Remainder of the bony pelvis grossly intact. SI joints approximated. No acute abnormality about the left hip. Mild degenerative changes noted within the lower lumbar spine. Suture material surgical clips present within the right lower quadrant. No soft tissue abnormality. IMPRESSION: 1. Acute nondisplaced fracture of the intertrochanteric right hip. 2. Additional comminuted fractures of the right superior pubic ramus, with additional fracture involving the right inferior pubic ramus. 3. Question additional subtle nondisplaced fracture  through the left superior pubic ramus. Electronically Signed   By: Jeannine Boga M.D.   On: 08/26/2016 20:31    EKG: Normal sinus rhythm at 60 bpm with normal axis and nonspecific ST-T wave changes.   Assessment/Plan Active Problems:   Closed intertrochanteric fracture of hip, right, initial encounter [facility])    This is a 83 y.o. female with a history of hypertension, hyperlipidemia, diabetes, anemia, dementia now being admitted with:  1. R intertrochanteric hip fracture - Admit to inpatient. -Surgical management per ortho -We'll check echo and pursue cardiology consultation for preop evaluation given history of diabetes, advanced dementia and no recent cardiac workup. -Continue after midnight -Pain control  2 CKD, at baseline -Gentle IV fluid hydration overnight  3. HTN -continue lisinopril 4. HLD-continue Zocor 5. DM-we'll cover with regular insulin sliding scale coverage every 4 hours 6. Continue Neurontin  Admission status: Inpatient IV Fluids: Normal saline Diet/Nutrition: Nothing by mouth Consults called: Ortho DVT Px: Heparin, SCDs and early ambulation. Code Status: Full Code  Disposition Plan: To be determined pending PT evaluation following surgery.  All the records are reviewed and case discussed with ED provider. Management plans discussed with the patient and/or family who express understanding and agree with plan of care.  Lilliemae Fruge D.O. on 08/26/2016 at 10:03 PM Between 7am to 6pm - Pager - [phone] After 6pm go to [email or web address] - [facility] Office [phone] CC:  Primary care physician; Adin Hector, MD   08/26/2016, 10:03 PM

## 2016-08-26 NOTE — ED Provider Notes (Signed)
[facility] Emergency Department Provider Note    First MD Initiated Contact with Patient 08/26/16 1943     (approximate)  I have reviewed the triage vital signs and the nursing notes.   HISTORY  Chief Complaint Fall  Level V Caveat:  Dementia  HPI Autumn Scott is a 83 y.o. female reported fall from standing that was unwitnessed several days ago with worsening right-sided hip pain. Patient was severe dementia and is unable to contribute much history.  Denies any recent fevers.  Cranial records patient is not on any blood thinners. She denies any chest pain at this time. No nausea or vomiting.   Past Medical History:  Diagnosis Date  . Anxiety   . High cholesterol   . Hypertension   . Neuropathy ([place])   . Thyroid disease    No family history on file. Past Surgical History:  Procedure Laterality Date  . ABDOMINAL HYSTERECTOMY    . APPENDECTOMY     There are no active problems to display for this patient.     Prior to Admission medications   Medication Sig Start Date End Date Taking? Authorizing Provider  gabapentin (NEURONTIN) 600 MG tablet Take 600 mg by mouth every 6 (six) hours.    Historical Provider, MD  levothyroxine (SYNTHROID, LEVOTHROID) 100 MCG tablet Take 100 mcg by mouth daily before breakfast.    Historical Provider, MD  lisinopril (PRINIVIL,ZESTRIL) 10 MG tablet Take 10 mg by mouth daily.    Historical Provider, MD  simvastatin (ZOCOR) 40 MG tablet Take 40 mg by mouth daily at 6 PM.    Historical Provider, MD    Allergies Patient has no known allergies.    Social History Social History  Substance Use Topics  . Smoking status: Never Smoker  . Smokeless tobacco: Never Used  . Alcohol use No    Review of Systems Patient denies headaches, rhinorrhea, blurry vision, numbness, shortness of breath, chest pain, edema, cough, abdominal pain, nausea, vomiting, diarrhea, dysuria, fevers, rashes or hallucinations unless  otherwise stated above in HPI. ____________________________________________   PHYSICAL EXAM:  VITAL SIGNS: Vitals:   08/26/16 2000 08/26/16 2030  BP: 131/61 (!) 148/64  Pulse: 72 65  Resp:    Temp:      Constitutional: Alert and in no acute distress. Eyes: Conjunctivae are normal. PERRL. EOMI. Head: Atraumatic. Nose: No congestion/rhinnorhea. Mouth/Throat: Mucous membranes are moist.  Oropharynx non-erythematous. Neck: No stridor. Painless ROM. No cervical spine tenderness to palpation Hematological/Lymphatic/Immunilogical: No cervical lymphadenopathy. Cardiovascular: Normal rate, regular rhythm. Grossly normal heart sounds.  Good peripheral circulation. Respiratory: Normal respiratory effort.  No retractions. Lungs CTAB. Gastrointestinal: Soft and nontender. No distention. No abdominal bruits. No CVA tenderness. Musculoskeletal: pain with log roll of right hip.  No effusion Autumn Scott distally, pelvis is stable to ap and lateral compression Neurologic:  Normal speech and language. No gross focal neurologic deficits are appreciated. No facial droop. Skin:  Skin is warm, dry and intact. No rash noted.    ____________________________________________   LABS (all labs ordered are listed, but only abnormal results are displayed)  Results for orders placed or performed during the hospital encounter of 08/26/16 (from the past 24 hour(s))  Glucose, capillary     Status: Abnormal   Collection Time: 08/26/16  7:41 PM  Result Value Ref Range   Glucose-Capillary 374 (H) 65 - 99 mg/dL  CBC with Differential/Platelet     Status: Abnormal   Collection Time: 08/26/16  7:54 PM  Result  Value Ref Range   WBC 10.8 3.6 - 11.0 K/uL   RBC 3.84 3.80 - 5.20 MIL/uL   Hemoglobin 11.6 (L) 12.0 - 16.0 g/dL   HCT 33.8 (L) 35.0 - 47.0 %   MCV 88.0 80.0 - 100.0 fL   MCH 30.2 26.0 - 34.0 pg   MCHC 34.4 32.0 - 36.0 g/dL   RDW 15.1 (H) 11.5 - 14.5 %   Platelets 283 150 - 440 K/uL   Neutrophils Relative %  83 %   Neutro Abs 9.0 (H) 1.4 - 6.5 K/uL   Lymphocytes Relative 6 %   Lymphs Abs 0.6 (L) 1.0 - 3.6 K/uL   Monocytes Relative 8 %   Monocytes Absolute 0.9 0.2 - 0.9 K/uL   Eosinophils Relative 2 %   Eosinophils Absolute 0.2 0 - 0.7 K/uL   Basophils Relative 1 %   Basophils Absolute 0.1 0 - 0.1 K/uL  Comprehensive metabolic panel     Status: Abnormal   Collection Time: 08/26/16  7:54 PM  Result Value Ref Range   Sodium 138 135 - 145 mmol/L   Potassium 3.5 3.5 - 5.1 mmol/L   Chloride 102 101 - 111 mmol/L   CO2 29 22 - 32 mmol/L   Glucose, Bld 433 (H) 65 - 99 mg/dL   BUN 43 (H) 6 - 20 mg/dL   Creatinine, Ser 1.25 (H) 0.44 - 1.00 mg/dL   Calcium 10.1 8.9 - 10.3 mg/dL   Total Protein 7.8 6.5 - 8.1 g/dL   Albumin 4.0 3.5 - 5.0 g/dL   AST 27 15 - 41 U/L   ALT 22 14 - 54 U/L   Alkaline Phosphatase 51 38 - 126 U/L   Total Bilirubin 1.0 0.3 - 1.2 mg/dL   GFR calc non Af Amer 39 (L) >60 mL/min   GFR calc Af Amer 45 (L) >60 mL/min   Anion gap 7 5 - 15   ____________________________________________  EKG My review and personal interpretation at Time: 20:46   Indication: fall  Rate: 60  Rhythm: sinus  Axis: normal Other: normal intervals, no acute ischemia ____________________________________________  RADIOLOGY  I personally reviewed all radiographic images ordered to evaluate for the above acute complaints and reviewed radiology reports and findings.  These findings were personally discussed with the patient.  Please see medical record for radiology report.  ____________________________________________   PROCEDURES  Procedure(s) performed:  Procedures    Critical Care performed: no ____________________________________________   INITIAL IMPRESSION / ASSESSMENT AND PLAN / ED COURSE  Pertinent labs & imaging results that were available during my care of the patient were reviewed by me and considered in my medical decision making (see chart for details).  DDX: Subdural,  subarachnoid, CVA, fracture, contusion  MERILYN FOBES is a 83 y.o. who presents to the ED with acute right hip pain after fall from standing. Presentation more difficult based by patient's significant dementia. She arrives afebrile hemodynamic stable. CT imaging of the head ordered to evaluate for acute traumatic injury is otherwise reassuring. No evidence of dysrhythmia. Blood work is also reassuring. X-ray of the hip shows evidence of a right intertrochanteric fracture. Based Rx patient is not on any blood thinners. Has spoken with or so who agrees to evaluate patient for operative repair. Spoke with hospitalist who agrees to admit patient for further evaluation and management.  Clinical Course      ____________________________________________   FINAL CLINICAL IMPRESSION(S) / ED DIAGNOSES  Final diagnoses:  Hip pain, acute, right  Closed displaced intertrochanteric fracture of right femur, initial encounter ([place])  Closed fracture of multiple pubic rami, right, initial encounter ([place])      NEW MEDICATIONS STARTED DURING THIS VISIT:  New Prescriptions   No medications on file     Note:  This document was prepared using Dragon voice recognition software and may include unintentional dictation errors.    Merlyn Lot, MD 08/26/16 2103

## 2016-08-26 NOTE — ED Triage Notes (Signed)
Pt arrived via ems for c/o fall on Friday - at time of fall pt had no complaints per family but over the last 24 hours has started to c/o right hip pain - 2/10 when sitting and 7/10 when manipulated /touched - pt cbg by ems was 410 and recheck in ed was 374 - pt has NO history of diabetes - pt is A&O to baseline per family

## 2016-08-26 NOTE — Consult Note (Signed)
ORTHOPAEDIC CONSULTATION  REQUESTING PHYSICIAN: Harvie Bridge, DO  Chief Complaint: Right hip pain  HPI: Autumn Scott is a 83 y.o. female with dementia who is sent to the [facility] emergency Department with complaints of right hip pain. The patient is reported to have had an unwitnessed fall within the past few days.  Patient is seen in the emergency department. There is no family members at the bedside at the time of my evaluation. Patient is unable to provide an accurate history given her dementia.  Past Medical History:  Diagnosis Date  . Anxiety   . High cholesterol   . Hypertension   . Neuropathy ([place])   . Thyroid disease    Past Surgical History:  Procedure Laterality Date  . ABDOMINAL HYSTERECTOMY    . APPENDECTOMY     Social History   Social History  . Marital status: Divorced    Spouse name: N/A  . Number of children: N/A  . Years of education: N/A   Social History Main Topics  . Smoking status: Never Smoker  . Smokeless tobacco: Never Used  . Alcohol use No  . Drug use: No  . Sexual activity: Not Asked   Other Topics Concern  . None   Social History Narrative  . None   No family history on file. No Known Allergies Prior to Admission medications   Medication Sig Start Date End Date Taking? Authorizing Provider  gabapentin (NEURONTIN) 600 MG tablet Take 600 mg by mouth every 6 (six) hours.   Yes Historical Provider, MD  levothyroxine (SYNTHROID, LEVOTHROID) 100 MCG tablet Take 100 mcg by mouth daily before breakfast.   Yes Historical Provider, MD  lisinopril (PRINIVIL,ZESTRIL) 10 MG tablet Take 10 mg by mouth daily.   Yes Historical Provider, MD  simvastatin (ZOCOR) 40 MG tablet Take 40 mg by mouth daily at 6 PM.   Yes Historical Provider, MD   Ct Head Wo Contrast  Result Date: 08/26/2016 CLINICAL DATA:  Status post fall, with concern for head injury. Initial encounter. EXAM: CT HEAD WITHOUT CONTRAST TECHNIQUE: Contiguous axial images  were obtained from the base of the skull through the vertex without intravenous contrast. COMPARISON:  CT of the head performed 07/06/2015 FINDINGS: Brain: No evidence of acute infarction, hemorrhage, hydrocephalus, extra-axial collection or mass lesion/mass effect. Prominence of the ventricles and sulci reflects moderate cortical volume loss. Mild cerebellar atrophy is noted. Scattered periventricular and subcortical white matter change likely reflects small vessel ischemic microangiopathy. The brainstem and fourth ventricle are within normal limits. The basal ganglia are unremarkable in appearance. The cerebral hemispheres demonstrate grossly normal gray-white differentiation. No mass effect or midline shift is seen. Vascular: No hyperdense vessel or unexpected calcification. Skull: There is no evidence of fracture; visualized osseous structures are unremarkable in appearance. Sinuses/Orbits: The visualized portions of the orbits are within normal limits. There is partial opacification of the right mastoid air cells and the middle ear. Would correlate for evidence of otitis media. The paranasal sinuses and left mastoid air cells are well-aerated. Other: No significant soft tissue abnormalities are seen. IMPRESSION: 1. No evidence of traumatic intracranial injury or fracture. 2. Moderate cortical volume loss and scattered small vessel ischemic microangiopathy. 3. Partial opacification of the right mastoid air cells and the middle ear. Would correlate for any evidence of right-sided otitis media. Electronically Signed   By: Garald Balding M.D.   On: 08/26/2016 20:31   Dg Hip Unilat With Pelvis 2-3 Views Right  Result Date: 08/26/2016 CLINICAL  DATA:  Initial evaluation for acute right hip pain status post recent fall. EXAM: DG HIP (WITH OR WITHOUT PELVIS) 2-3V RIGHT COMPARISON:  None. FINDINGS: There is an acute nondisplaced fracture through the intertrochanteric right hip. Right femoral head remains normally  aligned within the acetabulum. Femoral head height preserved. Additional comminuted fractures involving the right superior pubic ramus with extension towards the pubis symphysis. Additional nondisplaced fracture through the right inferior pubic ramus. On AP projection, there is alternatively, this may reflect an overlying skin fold. Remainder of the bony pelvis grossly intact. SI joints approximated. No acute abnormality about the left hip. Mild degenerative changes noted within the lower lumbar spine. Suture material surgical clips present within the right lower quadrant. No soft tissue abnormality. IMPRESSION: 1. Acute nondisplaced fracture of the intertrochanteric right hip. 2. Additional comminuted fractures of the right superior pubic ramus, with additional fracture involving the right inferior pubic ramus. 3. Question additional subtle nondisplaced fracture through the left superior pubic ramus. Electronically Signed   By: Jeannine Boga M.D.   On: 08/26/2016 20:31    Positive ROS: All other systems have been reviewed and were otherwise negative with the exception of those mentioned in the HPI and as above.  Physical Exam: General: Awake, confused, no acute distress  MUSCULOSKELETAL: Right lower extremity: Patient has mild shortening and external rotation. Her skin is intact. She has palpable pedal pulses and can flex and extend her toes and dorsiflex and plantarflex her ankle. Patient indicates she can feel me touching both of her feet.  Assessment: Displaced right intertrochanteric hip fracture, closed  Plan: I called and spoke with the patient's son, Timmothy Sours, and explained to him that his mother had sustained a right hip fracture during a recent fall. He was unaware that his mother had been brought to the hospital. I explained to him that I recommended intramedullary fixation for this fracture to provide the patient with fracture stabilization and pain control. He explains that his mother  ambulate unlimited amount at baseline.  I discussed the surgery in detail with him as well as the postoperative course. Discussed with him also the risks and benefits of surgery. He understands the risks include but are not limited to infection, bleeding requiring blood transfusion, nerve or blood vessel injury, malunion, nonunion, leg length discrepancy or change in lower extremity rotation, persistent hip pain, hardware failure and the need for further surgery. He understands that the medical risks include but are not limited to DVT and pulmonary embolism, myocardial infarction, stroke, pneumonia, respiratory failure and death.  I explained that his mother will be admitted and undergo preop evaluation by the hospitalist service tonight. Patient's surgery would be performed tomorrow pending medical clearance. I answered all his questions. I reviewed the patient's labs and radiographic studies in preparation for this case. I have put in presurgical orders and she is currently nothing by mouth after midnight.   Thornton Park, MD    08/26/2016 10:42 PM

## 2016-08-27 ENCOUNTER — Inpatient Hospital Stay: Payer: Medicare Other | Admitting: Certified Registered"

## 2016-08-27 ENCOUNTER — Encounter: Payer: Self-pay | Admitting: Anesthesiology

## 2016-08-27 ENCOUNTER — Inpatient Hospital Stay: Payer: Medicare Other

## 2016-08-27 ENCOUNTER — Encounter
Admission: EM | Disposition: A | Discharge status: Skilled Nursing Facility | Payer: Self-pay | Source: Home / Self Care | Attending: Internal Medicine

## 2016-08-27 HISTORY — PX: INTRAMEDULLARY (IM) NAIL INTERTROCHANTERIC: SHX5875

## 2016-08-27 LAB — BASIC METABOLIC PANEL
ANION GAP: 12 (ref 5–15)
BUN: 37 mg/dL — AB (ref 6–20)
CO2: 25 mmol/L (ref 22–32)
CREATININE: 0.82 mg/dL (ref 0.44–1.00)
Calcium: 10.3 mg/dL (ref 8.9–10.3)
Chloride: 103 mmol/L (ref 101–111)
GFR calc Af Amer: >60 mL/min (ref >60)
GLUCOSE: 217 mg/dL — AB (ref 65–99)
Potassium: 3.7 mmol/L (ref 3.5–5.1)
Sodium: 140 mmol/L (ref 135–145)

## 2016-08-27 LAB — APTT: APTT: 29 s (ref 24–36)

## 2016-08-27 LAB — CBC
HEMATOCRIT: 35.6 % (ref 35.0–47.0)
HEMOGLOBIN: 12.1 g/dL (ref 12.0–16.0)
MCH: 30.1 pg (ref 26.0–34.0)
MCHC: 34 g/dL (ref 32.0–36.0)
MCV: 88.5 fL (ref 80.0–100.0)
Platelets: 234 10*3/uL (ref 150–440)
RBC: 4.02 MIL/uL (ref 3.80–5.20)
RDW: 15 % — ABNORMAL HIGH (ref 11.5–14.5)
WBC: 8.9 10*3/uL (ref 3.6–11.0)

## 2016-08-27 LAB — URINALYSIS, ROUTINE W REFLEX MICROSCOPIC
BILIRUBIN URINE: NEGATIVE
GLUCOSE, UA: 50 mg/dL — AB
KETONES UR: NEGATIVE mg/dL
NITRITE: NEGATIVE
PH: 5 (ref 5.0–8.0)
Protein, ur: 30 mg/dL — AB
Specific Gravity, Urine: 1.027 (ref 1.005–1.030)

## 2016-08-27 LAB — GLUCOSE, CAPILLARY
GLUCOSE-CAPILLARY: 210 mg/dL — AB (ref 65–99)
Glucose-Capillary: 196 mg/dL — ABNORMAL HIGH (ref 65–99)
Glucose-Capillary: 201 mg/dL — ABNORMAL HIGH (ref 65–99)
Glucose-Capillary: 192 mg/dL — ABNORMAL HIGH (ref 65–99)
Glucose-Capillary: 218 mg/dL — ABNORMAL HIGH (ref 65–99)
Glucose-Capillary: 246 mg/dL — ABNORMAL HIGH (ref 65–99)

## 2016-08-27 LAB — TYPE AND SCREEN
ABO/RH(D): A POS
Antibody Screen: NEGATIVE

## 2016-08-27 LAB — SURGICAL PCR SCREEN
MRSA, PCR: NEGATIVE
STAPHYLOCOCCUS AUREUS: NEGATIVE

## 2016-08-27 LAB — PROTIME-INR
INR: 0.99
INR: 1.02
Prothrombin Time: 13.1 seconds (ref 11.4–15.2)
Prothrombin Time: 13.4 seconds (ref 11.4–15.2)

## 2016-08-27 SURGERY — FIXATION, FRACTURE, INTERTROCHANTERIC, WITH INTRAMEDULLARY ROD
Anesthesia: Spinal | Laterality: Right | Wound class: Clean

## 2016-08-27 MED ORDER — PHENYLEPHRINE HCL 10 MG/ML IJ SOLN
INTRAMUSCULAR | Status: DC | PRN
  Administered 2016-08-27 (×3): 100 ug via INTRAVENOUS

## 2016-08-27 MED ORDER — GLYCOPYRROLATE 0.2 MG/ML IJ SOLN
INTRAMUSCULAR | Status: DC | PRN
  Administered 2016-08-27: 0.1 mg via INTRAVENOUS

## 2016-08-27 MED ORDER — BUPIVACAINE HCL (PF) 0.5 % IJ SOLN
INTRAMUSCULAR | Status: DC | PRN
  Administered 2016-08-27: 3 mL via INTRATHECAL

## 2016-08-27 MED ORDER — BUPIVACAINE HCL (PF) 0.5 % IJ SOLN
INTRAMUSCULAR | Status: AC
  Filled 2016-08-27: qty 10

## 2016-08-27 MED ORDER — OXYCODONE HCL 5 MG PO TABS
5.0000 mg | ORAL_TABLET | ORAL | Status: DC | PRN
  Administered 2016-08-27 – 2016-08-29 (×3): 5 mg via ORAL
  Filled 2016-08-27 (×3): qty 1

## 2016-08-27 MED ORDER — ENOXAPARIN SODIUM 30 MG/0.3ML ~~LOC~~ SOLN
30.0000 mg | SUBCUTANEOUS | Status: DC
  Administered 2016-08-28: 30 mg via SUBCUTANEOUS
  Filled 2016-08-27: qty 0.3

## 2016-08-27 MED ORDER — LIDOCAINE HCL (PF) 2 % IJ SOLN
INTRAMUSCULAR | Status: DC | PRN
  Administered 2016-08-27: 50 mg

## 2016-08-27 MED ORDER — FENTANYL CITRATE (PF) 100 MCG/2ML IJ SOLN: 25.0000 ug | INTRAMUSCULAR | Status: DC | PRN

## 2016-08-27 MED ORDER — INSULIN ASPART 100 UNIT/ML ~~LOC~~ SOLN: 0.0000 [IU] | Freq: Every day | SUBCUTANEOUS | Status: DC

## 2016-08-27 MED ORDER — SODIUM CHLORIDE 0.9 % IV SOLN
75.0000 mL/h | INTRAVENOUS | Status: DC
  Administered 2016-08-27 (×2): 75 mL/h via INTRAVENOUS

## 2016-08-27 MED ORDER — ALUM & MAG HYDROXIDE-SIMETH 200-200-20 MG/5ML PO SUSP: 30.0000 mL | ORAL | Status: DC | PRN

## 2016-08-27 MED ORDER — INSULIN ASPART 100 UNIT/ML ~~LOC~~ SOLN
0.0000 [IU] | Freq: Every day | SUBCUTANEOUS | Status: DC
  Administered 2016-08-27: 2 [IU] via SUBCUTANEOUS
  Filled 2016-08-27: qty 3
  Filled 2016-08-27: qty 2

## 2016-08-27 MED ORDER — MAGNESIUM CITRATE PO SOLN
1.0000 | Freq: Once | ORAL | Status: DC | PRN
  Filled 2016-08-27: qty 296

## 2016-08-27 MED ORDER — PHENOL 1.4 % MT LIQD
1.0000 | OROMUCOSAL | Status: DC | PRN
  Filled 2016-08-27: qty 177

## 2016-08-27 MED ORDER — GLYCOPYRROLATE 0.2 MG/ML IJ SOLN
INTRAMUSCULAR | Status: AC
  Filled 2016-08-27: qty 1

## 2016-08-27 MED ORDER — SODIUM CHLORIDE 0.9 % IR SOLN
Status: DC | PRN
  Administered 2016-08-27: 13:00:00 via SURGICAL_CAVITY

## 2016-08-27 MED ORDER — DOCUSATE SODIUM 100 MG PO CAPS
100.0000 mg | ORAL_CAPSULE | Freq: Two times a day (BID) | ORAL | Status: DC
  Administered 2016-08-27 – 2016-08-29 (×5): 100 mg via ORAL
  Filled 2016-08-27 (×5): qty 1

## 2016-08-27 MED ORDER — FERROUS SULFATE 325 (65 FE) MG PO TABS
325.0000 mg | ORAL_TABLET | Freq: Three times a day (TID) | ORAL | Status: DC
  Administered 2016-08-27 – 2016-08-29 (×7): 325 mg via ORAL
  Filled 2016-08-27 (×5): qty 1

## 2016-08-27 MED ORDER — INSULIN ASPART 100 UNIT/ML ~~LOC~~ SOLN
0.0000 [IU] | Freq: Three times a day (TID) | SUBCUTANEOUS | Status: DC
  Administered 2016-08-28: 2 [IU] via SUBCUTANEOUS
  Administered 2016-08-28: 3 [IU] via SUBCUTANEOUS
  Administered 2016-08-28 – 2016-08-29 (×2): 2 [IU] via SUBCUTANEOUS
  Filled 2016-08-27: qty 2
  Filled 2016-08-27: qty 1
  Filled 2016-08-27: qty 3
  Filled 2016-08-27: qty 2

## 2016-08-27 MED ORDER — DEXTROSE 5 % IV SOLN
500.0000 mg | Freq: Four times a day (QID) | INTRAVENOUS | Status: DC | PRN
  Filled 2016-08-27: qty 5

## 2016-08-27 MED ORDER — PHENYLEPHRINE HCL 10 MG/ML IJ SOLN
INTRAMUSCULAR | Status: DC | PRN
  Administered 2016-08-27: 30 ug/min via INTRAVENOUS

## 2016-08-27 MED ORDER — CEFAZOLIN SODIUM-DEXTROSE 2-4 GM/100ML-% IV SOLN
2.0000 g | Freq: Four times a day (QID) | INTRAVENOUS | Status: AC
  Administered 2016-08-27 (×2): 2 g via INTRAVENOUS
  Filled 2016-08-27 (×2): qty 100

## 2016-08-27 MED ORDER — METOPROLOL TARTRATE 25 MG PO TABS: 25.0000 mg | ORAL_TABLET | Freq: Two times a day (BID) | ORAL | Status: DC

## 2016-08-27 MED ORDER — ONDANSETRON HCL 4 MG/2ML IJ SOLN: 4.0000 mg | Freq: Once | INTRAMUSCULAR | Status: DC | PRN

## 2016-08-27 MED ORDER — ACETAMINOPHEN 325 MG PO TABS
650.0000 mg | ORAL_TABLET | Freq: Four times a day (QID) | ORAL | Status: DC | PRN
Start: 2016-08-27 — End: 2016-08-29

## 2016-08-27 MED ORDER — INSULIN ASPART 100 UNIT/ML ~~LOC~~ SOLN: 0.0000 [IU] | Freq: Three times a day (TID) | SUBCUTANEOUS | Status: DC

## 2016-08-27 MED ORDER — CEFAZOLIN SODIUM-DEXTROSE 2-4 GM/100ML-% IV SOLN
INTRAVENOUS | Status: AC
  Filled 2016-08-27: qty 100

## 2016-08-27 MED ORDER — FENTANYL CITRATE (PF) 100 MCG/2ML IJ SOLN
INTRAMUSCULAR | Status: DC | PRN
  Administered 2016-08-27: 50 ug via INTRAVENOUS
  Administered 2016-08-27 (×2): 25 ug via INTRAVENOUS

## 2016-08-27 MED ORDER — FENTANYL CITRATE (PF) 100 MCG/2ML IJ SOLN
INTRAMUSCULAR | Status: AC
  Filled 2016-08-27: qty 2

## 2016-08-27 MED ORDER — PROPOFOL 500 MG/50ML IV EMUL
INTRAVENOUS | Status: DC | PRN
  Administered 2016-08-27: 25 ug/kg/min via INTRAVENOUS

## 2016-08-27 MED ORDER — METHOCARBAMOL 500 MG PO TABS
500.0000 mg | ORAL_TABLET | Freq: Four times a day (QID) | ORAL | Status: DC | PRN
  Administered 2016-08-27: 500 mg via ORAL
  Filled 2016-08-27: qty 1

## 2016-08-27 MED ORDER — METOPROLOL TARTRATE 25 MG PO TABS
12.5000 mg | ORAL_TABLET | Freq: Two times a day (BID) | ORAL | Status: DC
  Administered 2016-08-27 – 2016-08-29 (×4): 12.5 mg via ORAL
  Filled 2016-08-27 (×5): qty 1

## 2016-08-27 MED ORDER — PROPOFOL 10 MG/ML IV BOLUS
INTRAVENOUS | Status: DC | PRN
  Administered 2016-08-27: 20 mg via INTRAVENOUS

## 2016-08-27 MED ORDER — ONDANSETRON HCL 4 MG PO TABS: 4.0000 mg | ORAL_TABLET | Freq: Four times a day (QID) | ORAL | Status: DC | PRN

## 2016-08-27 MED ORDER — SODIUM CHLORIDE 0.9 % IV SOLN: INTRAVENOUS | Status: DC | PRN

## 2016-08-27 MED ORDER — MORPHINE SULFATE (PF) 2 MG/ML IV SOLN
2.0000 mg | INTRAVENOUS | Status: DC | PRN
  Administered 2016-08-27: 2 mg via INTRAVENOUS
  Filled 2016-08-27: qty 1

## 2016-08-27 MED ORDER — BISACODYL 10 MG RE SUPP: 10.0000 mg | Freq: Every day | RECTAL | Status: DC | PRN

## 2016-08-27 MED ORDER — POLYETHYLENE GLYCOL 3350 17 G PO PACK
17.0000 g | PACK | Freq: Every day | ORAL | Status: DC | PRN
  Administered 2016-08-28: 17 g via ORAL
  Filled 2016-08-27: qty 1

## 2016-08-27 MED ORDER — SENNA 8.6 MG PO TABS
1.0000 | ORAL_TABLET | Freq: Two times a day (BID) | ORAL | Status: DC
  Administered 2016-08-27 – 2016-08-29 (×5): 8.6 mg via ORAL
  Filled 2016-08-27 (×5): qty 1

## 2016-08-27 MED ORDER — LIDOCAINE 2% (20 MG/ML) 5 ML SYRINGE
INTRAMUSCULAR | Status: AC
  Filled 2016-08-27: qty 5

## 2016-08-27 MED ORDER — MENTHOL 3 MG MT LOZG
1.0000 | LOZENGE | OROMUCOSAL | Status: DC | PRN
Start: 2016-08-27 — End: 2016-08-29
  Filled 2016-08-27: qty 9

## 2016-08-27 MED ORDER — NEOMYCIN-POLYMYXIN B GU 40-200000 IR SOLN
Status: AC
  Filled 2016-08-27: qty 4

## 2016-08-27 MED ORDER — ACETAMINOPHEN 650 MG RE SUPP: 650.0000 mg | Freq: Four times a day (QID) | RECTAL | Status: DC | PRN

## 2016-08-27 MED ORDER — ONDANSETRON HCL 4 MG/2ML IJ SOLN: 4.0000 mg | Freq: Four times a day (QID) | INTRAMUSCULAR | Status: DC | PRN

## 2016-08-27 MED ORDER — SODIUM CHLORIDE 0.9 % IV SOLN
INTRAVENOUS | Status: DC
  Administered 2016-08-27: 50 mL/h via INTRAVENOUS
  Administered 2016-08-27: 03:00:00 via INTRAVENOUS

## 2016-08-27 SURGICAL SUPPLY — 45 items
BIT DRILL CANN LG 4.3MM (BIT) IMPLANT
BNDG COHESIVE 6X5 TAN STRL LF (GAUZE/BANDAGES/DRESSINGS) ×6 IMPLANT
CANISTER SUCT 1200ML W/VALVE (MISCELLANEOUS) ×3 IMPLANT
CATH FOL LEG HOLDER (MISCELLANEOUS) ×2 IMPLANT
DRAPE SHEET LG 3/4 BI-LAMINATE (DRAPES) ×6 IMPLANT
DRAPE SURG 17X11 SM STRL (DRAPES) ×6 IMPLANT
DRAPE U-SHAPE 47X51 STRL (DRAPES) ×3 IMPLANT
DRILL BIT CANN LG 4.3MM (BIT) ×3
DRSG OPSITE POSTOP 4X10 (GAUZE/BANDAGES/DRESSINGS) ×2 IMPLANT
DRSG OPSITE POSTOP 4X14 (GAUZE/BANDAGES/DRESSINGS) ×3 IMPLANT
DURAPREP 26ML APPLICATOR (WOUND CARE) ×6 IMPLANT
ELECT REM PT RETURN 9FT ADLT (ELECTROSURGICAL) ×3
ELECTRODE REM PT RTRN 9FT ADLT (ELECTROSURGICAL) ×1 IMPLANT
GLOVE BIOGEL PI IND STRL 7.0 (GLOVE) IMPLANT
GLOVE BIOGEL PI IND STRL 7.5 (GLOVE) IMPLANT
GLOVE BIOGEL PI IND STRL 9 (GLOVE) ×1 IMPLANT
GLOVE BIOGEL PI INDICATOR 7.0 (GLOVE) ×2
GLOVE BIOGEL PI INDICATOR 7.5 (GLOVE) ×2
GLOVE BIOGEL PI INDICATOR 9 (GLOVE) ×2
GLOVE SURG 9.0 ORTHO LTXF (GLOVE) ×6 IMPLANT
GOWN STRL REUS TWL 2XL XL LVL4 (GOWN DISPOSABLE) ×3 IMPLANT
GOWN STRL REUS W/ TWL LRG LVL3 (GOWN DISPOSABLE) ×1 IMPLANT
GOWN STRL REUS W/TWL LRG LVL3 (GOWN DISPOSABLE) ×3
GUIDEPIN VERSANAIL DSP 3.2X444 ×2 IMPLANT
HEMOVAC 400CC 10FR (MISCELLANEOUS) ×3 IMPLANT
HFN 125 DEG 11MM X 180MM (Orthopedic Implant) ×2 IMPLANT
KIT RM TURNOVER CYSTO AR (KITS) ×3 IMPLANT
MAT BLUE FLOOR 46X72 FLO (MISCELLANEOUS) ×3 IMPLANT
NDL FILTER BLUNT 18X1 1/2 (NEEDLE) IMPLANT
NEEDLE FILTER BLUNT 18X 1/2SAF (NEEDLE) ×2
NEEDLE FILTER BLUNT 18X1 1/2 (NEEDLE) ×1 IMPLANT
NS IRRIG 1000ML POUR BTL (IV SOLUTION) ×3 IMPLANT
PACK HIP COMPR (MISCELLANEOUS) ×3 IMPLANT
SCREW BONE CORTICAL 5.0X3 (Screw) ×2 IMPLANT
SCREW LAG HIP NAIL 10.5X95 (Screw) ×2 IMPLANT
SLEEVE PROTECTION STRL DISP (MISCELLANEOUS) ×2 IMPLANT
STAPLER SKIN PROX 35W (STAPLE) ×3 IMPLANT
SUCTION FRAZIER HANDLE 10FR (MISCELLANEOUS) ×2
SUCTION TUBE FRAZIER 10FR DISP (MISCELLANEOUS) ×1 IMPLANT
SUT VIC AB 0 CT1 36 (SUTURE) ×6 IMPLANT
SUT VIC AB 2-0 CT1 27 (SUTURE) ×3
SUT VIC AB 2-0 CT1 TAPERPNT 27 (SUTURE) ×1 IMPLANT
SUT VICRYL 0 AB UR-6 (SUTURE) ×3 IMPLANT
SYR 30ML LL (SYRINGE) ×3 IMPLANT
SYR 5ML LL (SYRINGE) ×2 IMPLANT

## 2016-08-27 NOTE — Progress Notes (Signed)
[facility] [place] NAME: Autumn Scott    MR#:  [id number]  DATE OF BIRTH:  1932/01/06  SUBJECTIVE:  Poor historian due to dementia. Son and daughter present in the room. Patient has no complaints. She had unwitnessed fall came in with right hip pain and found to have fracture. No cardiac history according to the son. REVIEW OF SYSTEMS:   Review of Systems  Unable to perform ROS: Dementia   Tolerating Diet:npo Tolerating PT: pending  DRUG ALLERGIES:  No Known Allergies  VITALS:  Blood pressure (!) 152/70, pulse 68, temperature 98.4 F (36.9 C), temperature source Oral, resp. rate 16, height 5' (1.524 m), weight 50.6 kg (111 lb 8 oz), SpO2 100 %.  PHYSICAL EXAMINATION:   Physical Exam  GENERAL:  83 y.o.-year-old patient lying in the bed with no acute distress.  EYES: Pupils equal, round, reactive to light and accommodation. No scleral icterus. Extraocular muscles intact.  HEENT: Head atraumatic, normocephalic. Oropharynx and nasopharynx clear.  NECK:  Supple, no jugular venous distention. No thyroid enlargement, no tenderness.  LUNGS: Normal breath sounds bilaterally, no wheezing, rales, rhonchi. No use of accessory muscles of respiration.  CARDIOVASCULAR: S1, S2 normal. No murmurs, rubs, or gallops.  ABDOMEN: Soft, nontender, nondistended. Bowel sounds present. No organomegaly or mass.  EXTREMITIES: No cyanosis, clubbing or edema b/l.   Right hip decreased range of motion secondary to fracture NEUROLOGIC: Grossly nonfocal unable to assess due to severe dementia  PSYCHIATRIC:  patient is alert and pleasantly confused  SKIN: No obvious rash, lesion, or ulcer.   LABORATORY PANEL:  CBC  Recent Labs Lab 08/27/16 0541  WBC 8.9  HGB 12.1  HCT 35.6  PLT 234    Chemistries   Recent Labs Lab 08/26/16 1954 08/27/16 0541  NA 138 140  K 3.5 3.7  CL 102 103  CO2 29 25  GLUCOSE 433* 217*  BUN 43* 37*  CREATININE  1.25* 0.82  CALCIUM 10.1 10.3  AST 27  --   ALT 22  --   ALKPHOS 51  --   BILITOT 1.0  --    Cardiac Enzymes No results for input(s): TROPONINI in the last 168 hours. RADIOLOGY:  Ct Head Wo Contrast  Result Date: 08/26/2016 CLINICAL DATA:  Status post fall, with concern for head injury. Initial encounter. EXAM: CT HEAD WITHOUT CONTRAST TECHNIQUE: Contiguous axial images were obtained from the base of the skull through the vertex without intravenous contrast. COMPARISON:  CT of the head performed 07/06/2015 FINDINGS: Brain: No evidence of acute infarction, hemorrhage, hydrocephalus, extra-axial collection or mass lesion/mass effect. Prominence of the ventricles and sulci reflects moderate cortical volume loss. Mild cerebellar atrophy is noted. Scattered periventricular and subcortical white matter change likely reflects small vessel ischemic microangiopathy. The brainstem and fourth ventricle are within normal limits. The basal ganglia are unremarkable in appearance. The cerebral hemispheres demonstrate grossly normal gray-white differentiation. No mass effect or midline shift is seen. Vascular: No hyperdense vessel or unexpected calcification. Skull: There is no evidence of fracture; visualized osseous structures are unremarkable in appearance. Sinuses/Orbits: The visualized portions of the orbits are within normal limits. There is partial opacification of the right mastoid air cells and the middle ear. Would correlate for evidence of otitis media. The paranasal sinuses and left mastoid air cells are well-aerated. Other: No significant soft tissue abnormalities are seen. IMPRESSION: 1. No evidence of traumatic intracranial injury or fracture. 2. Moderate cortical volume loss and scattered  small vessel ischemic microangiopathy. 3. Partial opacification of the right mastoid air cells and the middle ear. Would correlate for any evidence of right-sided otitis media. Electronically Signed   By: Garald Balding  M.D.   On: 08/26/2016 20:31   Dg Hip Unilat With Pelvis 2-3 Views Right  Result Date: 08/26/2016 CLINICAL DATA:  Initial evaluation for acute right hip pain status post recent fall. EXAM: DG HIP (WITH OR WITHOUT PELVIS) 2-3V RIGHT COMPARISON:  None. FINDINGS: There is an acute nondisplaced fracture through the intertrochanteric right hip. Right femoral head remains normally aligned within the acetabulum. Femoral head height preserved. Additional comminuted fractures involving the right superior pubic ramus with extension towards the pubis symphysis. Additional nondisplaced fracture through the right inferior pubic ramus. On AP projection, there is alternatively, this may reflect an overlying skin fold. Remainder of the bony pelvis grossly intact. SI joints approximated. No acute abnormality about the left hip. Mild degenerative changes noted within the lower lumbar spine. Suture material surgical clips present within the right lower quadrant. No soft tissue abnormality. IMPRESSION: 1. Acute nondisplaced fracture of the intertrochanteric right hip. 2. Additional comminuted fractures of the right superior pubic ramus, with additional fracture involving the right inferior pubic ramus. 3. Question additional subtle nondisplaced fracture through the left superior pubic ramus. Electronically Signed   By: Jeannine Boga M.D.   On: 08/26/2016 20:31   ASSESSMENT AND PLAN:  83 y.o. female with a history of hypertension, hyperlipidemia, diabetes, anemia, dementia now being admitted with:  1. R intertrochanteric hip fracture,mechanical unwitnessed at home -Surgical management per ortho -Patient has stress test done in the past which was negative for ischemia and no cardiac history per son and in her outpatient [facility] chart. -Perioperatively to blockers -Patient is at a low to intermediate risk for surgery. This was discussed with patient's son Timmothy Sours risk and complications discussed -Pain control  2 acute renal  failure-resolved with IV fluids -Gentle IV fluid   3. HTN -continue lisinopril, perioperative beta blockers  4. HLD-continue Zocor  5. DM-we'll cover with regular insulin sliding scale coverage every 4 hours  6. DVT prophylaxis Lovenox on hold due to his pending surgery  7. Discharge planning social worker consult Physical therapy after surgery  SCD and Ted's Case discussed with Care Management/Social Worker. Management plans discussed with the patient, family and they are in agreement.  CODE STATUS: Full according to son Timmothy Sours for now    TOTAL TIME TAKING CARE OF THIS PATIENT: 30 minutes.  >50% time spent on counselling and coordination of care  POSSIBLE D/C IN 2-3 DAYS, DEPENDING ON CLINICAL CONDITION.  Note: This dictation was prepared with Dragon dictation along with smaller phrase technology. Any transcriptional errors that result from this process are unintentional.  Nyjae Hodge M.D on 08/27/2016 at 9:30 AM  Between 7am to 6pm - Pager - [phone]  After 6pm go to [email or web address] - password [email or web address] [facility]  [facility]  Office  [phone]  CC: Primary care physician; Adin Hector, MD

## 2016-08-27 NOTE — Plan of Care (Addendum)
Patient awaiting transfer to OR. IV x2 patent.  Patient oriented X O, yet family in room and going down to waiting room. No jewelery, dentures removed, depends on, foley intact, specimen collected. Consents signed X2.  Vitals stable. Metoprolol given with small sip of water.  NPO since midnight.  CHG X2 completed.   Last BS 201 - no insulin given d/t OR request.Teds and FP in place.  No wounds noted /skin intact. Report given to Cleatis Polka, RN.

## 2016-08-27 NOTE — Anesthesia Preprocedure Evaluation (Signed)
Anesthesia Evaluation  Patient identified by MRN, date of birth, ID band Patient awake    Reviewed: Allergy & Precautions, NPO status , Patient's Chart, lab work & pertinent test results, reviewed documented beta blocker date and time   Airway Mallampati: II  TM Distance: >3 FB     Dental  (+) Chipped   Pulmonary           Cardiovascular hypertension, Pt. on medications      Neuro/Psych Anxiety    GI/Hepatic   Endo/Other    Renal/GU      Musculoskeletal   Abdominal   Peds  Hematology   Anesthesia Other Findings   Reproductive/Obstetrics                             Anesthesia Physical Anesthesia Plan  ASA: III  Anesthesia Plan: Spinal   Post-op Pain Management:    Induction:   Airway Management Planned:   Additional Equipment:   Intra-op Plan:   Post-operative Plan:   Informed Consent: I have reviewed the patients History and Physical, chart, labs and discussed the procedure including the risks, benefits and alternatives for the proposed anesthesia with the patient or authorized representative who has indicated his/her understanding and acceptance.     Plan Discussed with: CRNA  Anesthesia Plan Comments:         Anesthesia Quick Evaluation

## 2016-08-27 NOTE — Op Note (Signed)
DATE OF SURGERY:  08/27/2016  TIME: 1:39 PM  PATIENT NAME:  Autumn Scott  AGE: 83 y.o.  PRE-OPERATIVE DIAGNOSIS:  Right intertrochanteric hip fracture   POST-OPERATIVE DIAGNOSIS:  SAME  PROCEDURE:  INTRAMEDULLARY (IM) NAILING FOR RIGHT  INTERTROCHANTRIC HIP FRACTURE   SURGEON:  Thornton Park  OPERATIVE IMPLANTS: Biomet short 125 degree Affixus nail 11 x 180 , 95 mm lag screw with a 34 mm distal interlocking screw  PREOPERATIVE INDICATIONS:  Autumn Scott is a 83 y.o. year old who fell and suffered a hip fracture. She was brought into the ER and then admitted and medically cleared for surgical intervention.    The risks, benefits and alternatives were discussed with the patient and their family.  The risks include but are not limited to infection, bleeding, nerve or blood vessel injury, malunion, nonunion, hardware prominence, hardware failure, change in leg lengths or lower extremity rotation need for further surgery including hardware removal with conversion to a total hip arthroplasty. Medical risks include but are not limited to DVT and pulmonary embolism, myocardial infarction, stroke, pneumonia, respiratory failure and death. The patient and their family understood these risks and wished to proceed with surgery.  OPERATIVE PROCEDURE:  The patient was brought to the operating room and placed in the supine position on the fracture table. Spinal anesthesia was administered.  A closed reduction was performed under C-arm guidance.  The fracture reduction was confirmed on both AP and lateral views. A time out was performed to verify the patient's name, date of birth, medical record number, correct site of surgery correct procedure to be performed. The timeout was also used to verify the patient received antibiotics and all appropriate instruments, implants and radiographic studies were available in the room. Once all in attendance were in agreement, the case began. The patient was  prepped and draped in a sterile fashion. She received preoperative antibiotics.  An incision was made proximal to the greater trochanter in line with the femur. A guidewire was placed over the tip of the greater trochanter and advanced into the proximal femur to the level of the lesser trochanter.  Confirmation of the drill pin position was made on AP and lateral C-arm images.  The threaded guidepin was then overdrilled with the proximal femoral drill.  The nail was then inserted into the proximal femur, across the fracture site and into the femoral shaft. Its position was confirmed on AP and lateral C-arm images.   Once the nail was completely seated, the drill guide for the lag screw was placed through the guide arm for the Affixus nail. A guidepin was then placed through this drill guide and advanced through the lateral cortex of the femur, across the fracture site and into the femoral head achieving a tip apex distance of less than 25 mm. The length of the drill pin was measured to 95 mm, and then the drill for the lag screw was advanced through the lateral cortex, across the fracture site and up into the femoral head to 95 degree.  The lag screw was then advanced by hand into position across the fracture site into the femoral head. Its final position was confirmed on AP and lateral C-arm images. Compression was applied as traction was carefully released. The set screw in the top of the intramedullary rod was tightened by hand using a screwdriver. It was backed off a quarter turn to allow for compression at the fracture site.  The drill sleeve for the distal interlocking screw  was then placed through the Affixus guide arm. A small stab incision was made to allow the drill guide to approximate the lateral cortex of the femur. The drill for the distal interlocking screw was then advanced bicortically. The depth of this drill was measured to be 30mm.  A distal interlocking screw with the length measured was  then inserted by hand through the guide arm. Final C-arm images of the entire intramedullary construct were taken in both the AP and lateral planes.   The wounds were irrigated copiously and closed with 0 Vicryl for closure of the deep fascia and 2-0 Vicryl for subcutaneous closure. The skin was approximated with staples. A dry sterile dressing was applied. I was scrubbed and present the entire case and all sharp and instrument counts were correct at the conclusion of the case. Patient was transferred to hospital bed and brought to PACU in stable condition. I spoke with the patient's family in the postop consultation room to let them know the case had gone without complication and the patient was stable in the recovery room.  She will be partial weightbearing and begin physical and occupational therapy tomorrow. The patient will started on medical DVT prophylaxis tomorrow.    Timoteo Gaul, MD

## 2016-08-27 NOTE — Clinical Social Work Note (Signed)
Clinical Social Work Assessment  Patient Details  Name: Autumn Scott MRN: [id number] Date of Birth: 1932/01/12  Date of referral:  08/27/16               Reason for consult:  Discharge Planning, Facility Placement                Permission sought to share information with:  Chartered certified accountant granted to share information::  Yes, Verbal Permission Granted  Name::      [place]::   [place]   Relationship::     Contact Information:     Housing/Transportation Living arrangements for the past 2 months:  [place] of Information:  Patient, Adult Children Patient Interpreter Needed:  None Criminal Activity/Legal Involvement Pertinent to Current Situation/Hospitalization:  Yes Significant Relationships:  Adult Children Lives with:  Adult Children Do you feel safe going back to the place where you live?  Yes Need for family participation in patient care:  Yes (Comment)  Care giving concerns:  Patient lives in [place] with her son Autumn Scott.    Social Worker assessment / plan:  Social work Theatre manager received social work consult. PT has not met with patient at this time. Patient was alert and oriented and sitting up in bed. Social work Theatre manager met with patient and patient's son, Autumn Scott at bedside. Per patient, she lives in [place] with her son Autumn Scott. Patient as three sons total that all live in the area. Social work Theatre manager explained that PT will work with patient and determine if patient will need to go to SNF for short-term rehab or can go home and receive home health. Patient's son Autumn Scott is preferring patient to go to SNF as there would be no one home during the day to assist her. Patient verbally agreed she would go to a SNF if needed. Patient or patient's son does not have a preference of a facility at this time. Patient's son Autumn Scott is checking to see if he is the patient's HPOA.   Fl2 completed and faxed out.   Employment status:   Unemployed Nurse, adult PT Recommendations:  Not assessed at this time Information / Referral to community resources:     Patient/Family's Response to care:  Patient and patient's son Autumn Scott are open for patient going to SNF for rehab.   Patient/Family's Understanding of and Emotional Response to Diagnosis, Current Treatment, and Prognosis:  Patient and patient's son was pleasant and thanked social work Theatre manager for coming by.   Emotional Assessment Appearance:  Appears stated age Attitude/Demeanor/Rapport:    Affect (typically observed):  Accepting, Adaptable, Appropriate Orientation:  Oriented to Self, Oriented to Place, Oriented to  Time, Oriented to Situation Alcohol / Substance use:  Not Applicable Psych involvement (Current and /or in the community):  No (Comment)  Discharge Needs  Concerns to be addressed:  Basic Needs Readmission within the last 30 days:  No Current discharge risk:  Dependent with Mobility Barriers to Discharge:  Continued Medical Work up   [employer], Student-Social Work 08/27/2016, 4:04 PM

## 2016-08-27 NOTE — NC FL2 (Signed)
[place] LEVEL OF CARE SCREENING TOOL     IDENTIFICATION  Patient Name: Autumn Scott Birthdate: 1931/11/09 Sex: female Admission Date (Current Location): 08/26/2016  [place] and [place] Number:  Engineering geologist and Address:  [facility], [address], [place], [place] [address]      Provider Number: [id number]  Attending Physician Name and Address:  Fritzi Mandes, MD  Relative Name and Phone Number:       Current Level of Care: Hospital Recommended Level of Care: [place] Prior Approval Number:    Date Approved/Denied:   PASRR Number:  ([id number])  Discharge Plan: SNF    Current Diagnoses: Patient Active Problem List   Diagnosis Date Noted  . Closed intertrochanteric fracture of hip, right, initial encounter ([place]) 08/26/2016    Orientation RESPIRATION BLADDER Height & Weight     Self, Time  Normal External catheter Weight: 110 lb (49.9 kg) Height:  5' (152.4 cm)  BEHAVIORAL SYMPTOMS/MOOD NEUROLOGICAL BOWEL NUTRITION STATUS   (None. )  (None. ) Continent Diet (Diet: NPO due to surgery)  AMBULATORY STATUS COMMUNICATION OF NEEDS Skin   Extensive Assist Verbally Normal                       Personal Care Assistance Level of Assistance  Bathing, Feeding, Dressing Bathing Assistance: Limited assistance Feeding assistance: Independent Dressing Assistance: Limited assistance     Functional Limitations Info  Hearing, Speech, Sight Sight Info: Adequate Hearing Info: Adequate Speech Info: Adequate    SPECIAL CARE FACTORS FREQUENCY  PT (By licensed PT), OT (By licensed OT)     PT Frequency:  (5) OT Frequency:  (5)            Contractures      Additional Factors Info  Code Status, Allergies, Insulin Sliding Scale Code Status Info:  (Full Code) Allergies Info:  (No Known Allergies )   Insulin Sliding Scale Info:  (NovoLog )       Current Medications (08/27/2016):  This is the current  hospital active medication list Current Facility-Administered Medications  Medication Dose Route Frequency Provider Last Rate Last Dose  . 0.9 %  sodium chloride infusion   Intravenous Continuous Alexis Hugelmeyer, DO 50 mL/hr at 08/27/16 0243    . [MAR Hold] bisacodyl (DULCOLAX) EC tablet 5 mg  5 mg Oral Daily PRN Alexis Hugelmeyer, DO      . ceFAZolin (ANCEF) IVPB 2g/100 mL premix  2 g Intravenous 30 min Pre-Op Thornton Park, MD      . Doug Sou Hold] gabapentin (NEURONTIN) tablet 600 mg  600 mg Oral Q6H Alexis Hugelmeyer, DO      . [MAR Hold] heparin injection 5,000 Units  5,000 Units Subcutaneous Q8H Alexis Hugelmeyer, DO      . [MAR Hold] HYDROcodone-acetaminophen (NORCO/VICODIN) 5-325 MG per tablet 1-2 tablet  1-2 tablet Oral Q6H PRN Alexis Hugelmeyer, DO      . [MAR Hold] insulin aspart (novoLOG) injection 0-5 Units  0-5 Units Subcutaneous QHS Alexis Hugelmeyer, DO   4 Units at 08/27/16 0006  . [MAR Hold] levothyroxine (SYNTHROID, LEVOTHROID) tablet 100 mcg  100 mcg Oral QAC breakfast Alexis Hugelmeyer, DO      . [MAR Hold] lisinopril (PRINIVIL,ZESTRIL) tablet 10 mg  10 mg Oral Daily Alexis Hugelmeyer, DO      . [MAR Hold] magnesium citrate solution 1 Bottle  1 Bottle Oral Once PRN Alexis Hugelmeyer, DO      . [  MAR Hold] methocarbamol (ROBAXIN) tablet 500 mg  500 mg Oral Q6H PRN Thornton Park, MD       Or  . Doug Sou Hold] methocarbamol (ROBAXIN) 500 mg in dextrose 5 % 50 mL IVPB  500 mg Intravenous Q6H PRN Thornton Park, MD      . Doug Sou Hold] metoprolol tartrate (LOPRESSOR) tablet 12.5 mg  12.5 mg Oral BID Fritzi Mandes, MD   12.5 mg at 08/27/16 1013  . [MAR Hold] morphine 2 MG/ML injection 0.5 mg  0.5 mg Intravenous Q2H PRN Alexis Hugelmeyer, DO      . [MAR Hold] oxyCODONE (Oxy IR/ROXICODONE) immediate release tablet 5-10 mg  5-10 mg Oral Q4H PRN Thornton Park, MD      . Doug Sou Hold] senna-docusate (Senokot-S) tablet 1 tablet  1 tablet Oral QHS PRN Alexis Hugelmeyer, DO      . [MAR Hold]  simvastatin (ZOCOR) tablet 40 mg  40 mg Oral q1800 Alexis Hugelmeyer, DO         Discharge Medications: Please see discharge summary for a list of discharge medications.  Relevant Imaging Results:  Relevant Lab Results:   Additional Information  (SSN: [id number])  Danie Chandler, Student-Social Work

## 2016-08-27 NOTE — Progress Notes (Signed)
Inpatient Diabetes Program Recommendations  AACE/ADA: New Consensus Statement on Inpatient Glycemic Control (2015)  Target Ranges:  Prepandial:   less than 140 mg/dL      Peak postprandial:   less than 180 mg/dL (1-2 hours)      Critically ill patients:  140 - 180 mg/dL   Results for Autumn Scott, Autumn Scott (MRN [id number]) as of 08/27/2016 08:57  Ref. Range 08/26/2016 19:41 08/27/2016 00:02 08/27/2016 04:01 08/27/2016 07:13  Glucose-Capillary Latest Ref Range: 65 - 99 mg/dL 374 (H) 303 (H) 196 (H) 201 (H)   Review of Glycemic Control  Diabetes history: DM2 Outpatient Diabetes medications: None listed on home medication list Current orders for Inpatient glycemic control: Novolog 0-5 units QHS  Inpatient Diabetes Program Recommendations: Correction (SSI): Please consider discontinuing Novolog 0-5 units QHS and order CBGs and Novolog 0-9 units Q4H while NPO.  Thanks, Barnie Alderman, RN, MSN, CDE Diabetes Coordinator Inpatient Diabetes Program [phone] (Team Pager from 8am to 5pm)

## 2016-08-27 NOTE — Anesthesia Procedure Notes (Signed)
Spinal  Patient location during procedure: OR Staffing Anesthesiologist: Gunnar Bulla Resident/CRNA: Rolla Plate Performed: resident/CRNA  Preanesthetic Checklist Completed: patient identified, site marked, surgical consent, pre-op evaluation, timeout performed, IV checked, risks and benefits discussed and monitors and equipment checked Spinal Block Patient position: sitting Prep: ChloraPrep and site prepped and draped Patient monitoring: heart rate, continuous pulse ox, blood pressure and cardiac monitor Approach: midline Location: L4-5 Injection technique: single-shot Needle Needle type: Introducer and Pencan  Needle gauge: 24 G Needle length: 9 cm Additional Notes Negative paresthesia. Negative blood return. Positive free-flowing CSF. Expiration date of kit checked and confirmed. Patient tolerated procedure well, without complications.

## 2016-08-27 NOTE — Transfer of Care (Signed)
Immediate Anesthesia Transfer of Care Note  Patient: Autumn Scott  Procedure(s) Performed: Procedure(s): INTRAMEDULLARY (IM) NAIL INTERTROCHANTRIC (Right)  Patient Location: PACU  Anesthesia Type:Spinal  Level of Consciousness: awake  Airway & Oxygen Therapy: Patient Spontanous Breathing and Patient connected to face mask oxygen  Post-op Assessment: Report given to RN and Post -op Vital signs reviewed and stable  Post vital signs: Reviewed  Last Vitals:  Vitals:   08/27/16 1107 08/27/16 1321  BP: (!) 155/71 123/72  Pulse: 70   Resp: 15 12  Temp: 36.2 C 36.6 C    Last Pain:  Vitals:   08/27/16 1107  TempSrc: Tympanic  PainSc: 0-No pain         Complications: No apparent anesthesia complications

## 2016-08-27 NOTE — Clinical Social Work Placement (Signed)
   CLINICAL SOCIAL WORK PLACEMENT  NOTE  Date:  08/27/2016  Patient Details  Name: [name] MRN: [id number] Date of Birth: Nov 22, 1931  Clinical Social Work is seeking post-discharge placement for this patient at the [place] level of care (*CSW will initial, date and re-position this form in  chart as items are completed):  Yes   Patient/family provided with [place] Work Department's list of facilities offering this level of care within the geographic area requested by the patient (or if unable, by the patient's family).  Yes   Patient/family informed of their freedom to choose among providers that offer the needed level of care, that participate in Medicare, Medicaid or managed care program needed by the patient, have an available bed and are willing to accept the patient.  Yes   Patient/family informed of [facility]'s ownership interest in [facility] and [facility], as well as of the fact that they are under no obligation to receive care at these facilities.  PASRR submitted to EDS on 08/27/16     PASRR number received on 08/27/16     Existing PASRR number confirmed on       FL2 transmitted to all facilities in geographic area requested by pt/family on 08/27/16     FL2 transmitted to all facilities within larger geographic area on       Patient informed that his/her managed care company has contracts with or will negotiate with certain facilities, including the following:            Patient/family informed of bed offers received.  Patient chooses bed at       Physician recommends and patient chooses bed at      Patient to be transferred to   on  .  Patient to be transferred to facility by       Patient family notified on   of transfer.  Name of family member notified:        PHYSICIAN       Additional Comment:    _______________________________________________ Janthony Holleman, Veronia Beets, LCSW 08/27/2016, 4:53 PM

## 2016-08-28 ENCOUNTER — Encounter: Payer: Self-pay | Admitting: Orthopedic Surgery

## 2016-08-28 LAB — CBC
HCT: 33.3 % — ABNORMAL LOW (ref 35.0–47.0)
Hemoglobin: 11.3 g/dL — ABNORMAL LOW (ref 12.0–16.0)
MCH: 30 pg (ref 26.0–34.0)
MCHC: 33.9 g/dL (ref 32.0–36.0)
MCV: 88.5 fL (ref 80.0–100.0)
PLATELETS: 272 10*3/uL (ref 150–440)
RBC: 3.76 MIL/uL — ABNORMAL LOW (ref 3.80–5.20)
RDW: 15.2 % — AB (ref 11.5–14.5)
WBC: 8.3 10*3/uL (ref 3.6–11.0)

## 2016-08-28 LAB — BASIC METABOLIC PANEL
ANION GAP: 9 (ref 5–15)
BUN: 24 mg/dL — ABNORMAL HIGH (ref 6–20)
CO2: 25 mmol/L (ref 22–32)
Calcium: 8.9 mg/dL (ref 8.9–10.3)
Chloride: 104 mmol/L (ref 101–111)
Creatinine, Ser: 0.86 mg/dL (ref 0.44–1.00)
Glucose, Bld: 208 mg/dL — ABNORMAL HIGH (ref 65–99)
Potassium: 3.8 mmol/L (ref 3.5–5.1)
Sodium: 138 mmol/L (ref 135–145)

## 2016-08-28 LAB — GLUCOSE, CAPILLARY
GLUCOSE-CAPILLARY: 193 mg/dL — AB (ref 65–99)
Glucose-Capillary: 189 mg/dL — ABNORMAL HIGH (ref 65–99)
Glucose-Capillary: 209 mg/dL — ABNORMAL HIGH (ref 65–99)
Glucose-Capillary: 268 mg/dL — ABNORMAL HIGH (ref 65–99)

## 2016-08-28 LAB — URINE CULTURE

## 2016-08-28 MED ORDER — ENSURE ENLIVE PO LIQD
237.0000 mL | Freq: Two times a day (BID) | ORAL | Status: DC
  Administered 2016-08-29: 237 mL via ORAL

## 2016-08-28 MED ORDER — CEPHALEXIN 500 MG PO CAPS
500.0000 mg | ORAL_CAPSULE | Freq: Two times a day (BID) | ORAL | Status: DC
  Administered 2016-08-28 – 2016-08-29 (×3): 500 mg via ORAL
  Filled 2016-08-28 (×3): qty 1

## 2016-08-28 NOTE — Progress Notes (Signed)
Physical Therapy Treatment Patient Details Name: Autumn Scott MRN: [id number] DOB: 05/03/1932 Today's Date: 08/28/2016    History of Present Illness Pt admitted for R hip fx s/p fall and now is POD 1 from IM nailing. Pt also with fx of R sup/inf pubic rami. Pt with history of HTN, DM, anemia, and dementia.     PT Comments    Autumn Scott made modest progress today with max encouragement.  She ambulated 10 ft around the bed with max verbal cues for sequencing and min assist to steady and for positioning of RW.  She requires mod assist to boost to standing and mod assist for sit>supine.  SNF remains most appropriate d/c plan.  Pt will benefit from continued skilled PT services to increase functional independence and safety.   Follow Up Recommendations  SNF     Equipment Recommendations       Recommendations for Other Services       Precautions / Restrictions Precautions Precautions: Fall Restrictions Weight Bearing Restrictions: Yes RLE Weight Bearing: Weight bearing as tolerated    Mobility  Bed Mobility Overal bed mobility: Needs Assistance Bed Mobility: Sit to Supine     Supine to sit: Mod assist Sit to supine: Mod assist   General bed mobility comments: Assist to manage BLEs and direct trunk into supine position.    Transfers Overall transfer level: Needs assistance Equipment used: Rolling walker (2 wheeled) Transfers: Sit to/from Stand Sit to Stand: Mod assist         General transfer comment: Assist to boost to standing and to promote anterior weight shift as pt demonstrates a posterior bias.  Pt very slow to stand and anxious, asking for assistance.  Ambulation/Gait Ambulation/Gait assistance: Min assist Ambulation Distance (Feet): 10 Feet Assistive device: Rolling walker (2 wheeled) Gait Pattern/deviations: Step-to pattern;Decreased stride length;Decreased weight shift to right;Decreased stance time - right;Decreased step length - left;Antalgic;Trunk  flexed;Narrow base of support Gait velocity: decreased Gait velocity interpretation: <1.8 ft/sec, indicative of risk for recurrent falls General Gait Details: Pt ambulated around bed with max verbal sequencing cues for advancement of each foot.  Pt asks repetitively, "can I sit now?" even when in the middle of the room.  Min assist for positioning of RW and for assist to steady.  Pt reaches of multiple times for side table and computer station and requires verbal cues to keep her hands on the RW.   Stairs            Wheelchair Mobility    Modified Rankin (Stroke Patients Only)       Balance Overall balance assessment: History of Falls;Needs assistance Sitting-balance support: Feet supported;Single extremity supported Sitting balance-Leahy Scale: Fair   Postural control: Posterior lean Standing balance support: Bilateral upper extremity supported;During functional activity Standing balance-Leahy Scale: Poor Standing balance comment: Pt relies on RW for support for static and dynamic activities                    Cognition Arousal/Alertness: Awake/alert Behavior During Therapy: WFL for tasks assessed/performed Overall Cognitive Status: History of cognitive impairments - at baseline                      Exercises Total Joint Exercises Hip ABduction/ADduction: AAROM;Right;10 reps;Seated Straight Leg Raises: AAROM;Right;10 reps;Seated Long Arc Quad: Strengthening;Right;10 reps;Seated Other Exercises Other Exercises: supine ther-ex performed on B LE including ankle pumps, SLRs, and hip ab/ad. All ther-ex performed x 10 reps with min/mod assist for  sequencing    General Comments General comments (skin integrity, edema, etc.): Son present on and off throughout session to encourage pt      Pertinent Vitals/Pain Pain Assessment: Faces Faces Pain Scale: Hurts even more Pain Location: R hip/buttocks Pain Descriptors / Indicators: Operative site  guarding;Moaning Pain Intervention(s): Limited activity within patient's tolerance;Monitored during session;Repositioned    Home Living Family/patient expects to be discharged to:: Private residence Living Arrangements: Children Available Help at Discharge: Available PRN/intermittently Type of Home:  (TBD- poor historian)       Home Equipment: Gilford Rile - 2 wheels Additional Comments: Pt is poor historian, per chart lives son, however is not available 24/7. Unsure of home environment.    Prior Function Level of Independence: Needs assistance      Comments: Pt is confused and poor historian. From chart review, was able to ambulate some with RW   PT Goals (current goals can now be found in the care plan section) Acute Rehab PT Goals Patient Stated Goal: to get to the chair PT Goal Formulation: With patient Time For Goal Achievement: 09/11/16 Potential to Achieve Goals: Good Additional Goals Additional Goal #1: Pt will be able to perform bed mobility/transfers with min assist and RW to improve functional independence Progress towards PT goals: Progressing toward goals    Frequency    BID      PT Plan Current plan remains appropriate    Co-evaluation             End of Session Equipment Utilized During Treatment: Gait belt Activity Tolerance: Patient limited by fatigue;Patient limited by pain Patient left: in bed;with call bell/phone within reach;with bed alarm set;with SCD's reapplied     Time: [id number] PT Time Calculation (min) (ACUTE ONLY): 22 min  Charges:  $Gait Training: 8-22 mins $Therapeutic Exercise: 8-22 mins                    G Codes:      Autumn Scott PT, DPT 08/28/2016, 2:50 PM

## 2016-08-28 NOTE — Evaluation (Signed)
Physical Therapy Evaluation Patient Details Name: Autumn Scott MRN: [id number] DOB: 1932/02/01 Today's Date: 08/28/2016   History of Present Illness  Pt admitted for R hip fx s/p fall and now is POD 1 from IM nailing. Pt also with fx of R sup/inf pubic rami. Pt with history of HTN, DM, anemia, and dementia.   Clinical Impression  Pt is a pleasant 83 year old female who was admitted for R hip IM nailing s/p fall. Pt only oriented to self, needs to be reminded multiple times why her leg is sore. Pt picking at bandage. Pt performs bed mobility/transfers with mod assist and ambulation with min assist +2 and RW. She is able to follow commands for participation in therapy. Pt demonstrates deficits with strength/cognition/balance/pain. Would benefit from skilled PT to address above deficits and promote optimal return to PLOF; recommend transition to STR upon discharge from acute hospitalization.       Follow Up Recommendations SNF    Equipment Recommendations       Recommendations for Other Services       Precautions / Restrictions Precautions Precautions: Fall Restrictions Weight Bearing Restrictions: Yes RLE Weight Bearing: Weight bearing as tolerated      Mobility  Bed Mobility Overal bed mobility: Needs Assistance Bed Mobility: Supine to Sit     Supine to sit: Mod assist     General bed mobility comments: Pt needs assist for sliding B LEs off bed and scooting hips out using pad. Once seated at EOB, pt needs min assist to maintain seated balance but able to progress to sitting with cga.  Transfers Overall transfer level: Needs assistance Equipment used: Rolling walker (2 wheeled) Transfers: Sit to/from Stand Sit to Stand: Mod assist         General transfer comment: assist for reaching for RW upon standing. Able to bear weight through R LE. Upright posture noted.  Ambulation/Gait Ambulation/Gait assistance: Min assist;+2 physical assistance Ambulation Distance  (Feet): 3 Feet Assistive device: Rolling walker (2 wheeled) Gait Pattern/deviations: Step-to pattern     General Gait Details: ambulated to recliner with heavy cues for taking steps and sequencing of RW. Pt very fearful of falling, however responds well to verbal/tactile cues. Visably drained with minimal exertion.  Stairs            Wheelchair Mobility    Modified Rankin (Stroke Patients Only)       Balance Overall balance assessment: History of Falls;Needs assistance Sitting-balance support: Feet supported Sitting balance-Leahy Scale: Fair     Standing balance support: Bilateral upper extremity supported Standing balance-Leahy Scale: Fair                               Pertinent Vitals/Pain Pain Assessment: Faces Faces Pain Scale: Hurts little more Pain Location: R hip Pain Descriptors / Indicators: Operative site guarding Pain Intervention(s): Limited activity within patient's tolerance    Home Living Family/patient expects to be discharged to:: Private residence Living Arrangements: Children Available Help at Discharge: Available PRN/intermittently Type of Home:  (TBD- poor historian)         Home Equipment: Gilford Rile - 2 wheels Additional Comments: Pt is poor historian, per chart lives son, however is not available 24/7. Unsure of home environment.    Prior Function Level of Independence: Needs assistance         Comments: Pt is confused and poor historian. From chart review, was able to ambulate some with RW  Hand Dominance        Extremity/Trunk Assessment   Upper Extremity Assessment Upper Extremity Assessment: Generalized weakness (B UE grossly 4/5)    Lower Extremity Assessment Lower Extremity Assessment: Generalized weakness (R LE grossly 2/5; L LE grossly 4/5)       Communication   Communication: No difficulties  Cognition Arousal/Alertness: Awake/alert Behavior During Therapy: WFL for tasks  assessed/performed Overall Cognitive Status: History of cognitive impairments - at baseline                      General Comments      Exercises Other Exercises Other Exercises: supine ther-ex performed on B LE including ankle pumps, SLRs, and hip ab/ad. All ther-ex performed x 10 reps with min/mod assist for sequencing   Assessment/Plan    PT Assessment Patient needs continued PT services  PT Problem List Decreased strength;Decreased activity tolerance;Decreased balance;Decreased mobility;Pain;Decreased knowledge of use of DME          PT Treatment Interventions DME instruction;Gait training;Therapeutic activities;Therapeutic exercise    PT Goals (Current goals can be found in the Care Plan section)  Acute Rehab PT Goals Patient Stated Goal: to get stronger PT Goal Formulation: With patient Time For Goal Achievement: 09/11/16 Potential to Achieve Goals: Good    Frequency BID   Barriers to discharge Decreased caregiver support      Co-evaluation               End of Session Equipment Utilized During Treatment: Gait belt Activity Tolerance: Patient tolerated treatment well Patient left: in chair;with chair alarm set;with nursing/sitter in room Nurse Communication: Mobility status         Time: [id number] PT Time Calculation (min) (ACUTE ONLY): 22 min   Charges:   PT Evaluation $PT Eval Moderate Complexity: 1 Procedure PT Treatments $Therapeutic Exercise: 8-22 mins   PT G Codes:        Autumn Scott 13-Sep-2016, 12:23 PM  Autumn Scott, PT, DPT [phone]

## 2016-08-28 NOTE — Progress Notes (Signed)
Pt is resting well. Changed honeycomb dressing when it was found lying in the bed. Foley removed at 0555, Pt tolerated well.

## 2016-08-28 NOTE — Progress Notes (Addendum)
[facility] [place] NAME: Autumn Scott    MR#:  [id number]  DATE OF BIRTH:  1932/07/26  SUBJECTIVE:  Poor historian due to dementia. Patient has no complaints. She had unwitnessed fall came in with right hip pain and found to have fracture. POD #1 REVIEW OF SYSTEMS:   Review of Systems  Unable to perform ROS: Dementia   Tolerating Diet:regular Tolerating PT: STR  DRUG ALLERGIES:  No Known Allergies  VITALS:  Blood pressure (!) 146/69, pulse 66, temperature 98.3 F (36.8 C), temperature source Oral, resp. rate 16, height 5' (1.524 m), weight 49.9 kg (110 lb), SpO2 98 %.  PHYSICAL EXAMINATION:   Physical Exam  GENERAL:  83 y.o.-year-old patient lying in the bed with no acute distress.  EYES: Pupils equal, round, reactive to light and accommodation. No scleral icterus. Extraocular muscles intact.  HEENT: Head atraumatic, normocephalic. Oropharynx and nasopharynx clear.  NECK:  Supple, no jugular venous distention. No thyroid enlargement, no tenderness.  LUNGS: Normal breath sounds bilaterally, no wheezing, rales, rhonchi. No use of accessory muscles of respiration.  CARDIOVASCULAR: S1, S2 normal. No murmurs, rubs, or gallops.  ABDOMEN: Soft, nontender, nondistended. Bowel sounds present. No organomegaly or mass.  EXTREMITIES: No cyanosis, clubbing or edema b/l.   Right hip decreased range of motion secondary to fracture NEUROLOGIC: Grossly nonfocal unable to assess due to severe dementia  PSYCHIATRIC:  patient is alert and pleasantly confused  SKIN: No obvious rash, lesion, or ulcer.   LABORATORY PANEL:  CBC  Recent Labs Lab 08/28/16 0445  WBC 8.3  HGB 11.3*  HCT 33.3*  PLT 272    Chemistries   Recent Labs Lab 08/26/16 1954  08/28/16 0445  NA 138  < > 138  K 3.5  < > 3.8  CL 102  < > 104  CO2 29  < > 25  GLUCOSE 433*  < > 208*  BUN 43*  < > 24*  CREATININE 1.25*  < > 0.86  CALCIUM 10.1  < > 8.9  AST 27   --   --   ALT 22  --   --   ALKPHOS 51  --   --   BILITOT 1.0  --   --   < > = values in this interval not displayed. Cardiac Enzymes No results for input(s): TROPONINI in the last 168 hours. RADIOLOGY:  Ct Head Wo Contrast  Result Date: 08/26/2016 CLINICAL DATA:  Status post fall, with concern for head injury. Initial encounter. EXAM: CT HEAD WITHOUT CONTRAST TECHNIQUE: Contiguous axial images were obtained from the base of the skull through the vertex without intravenous contrast. COMPARISON:  CT of the head performed 07/06/2015 FINDINGS: Brain: No evidence of acute infarction, hemorrhage, hydrocephalus, extra-axial collection or mass lesion/mass effect. Prominence of the ventricles and sulci reflects moderate cortical volume loss. Mild cerebellar atrophy is noted. Scattered periventricular and subcortical white matter change likely reflects small vessel ischemic microangiopathy. The brainstem and fourth ventricle are within normal limits. The basal ganglia are unremarkable in appearance. The cerebral hemispheres demonstrate grossly normal gray-white differentiation. No mass effect or midline shift is seen. Vascular: No hyperdense vessel or unexpected calcification. Skull: There is no evidence of fracture; visualized osseous structures are unremarkable in appearance. Sinuses/Orbits: The visualized portions of the orbits are within normal limits. There is partial opacification of the right mastoid air cells and the middle ear. Would correlate for evidence of otitis media. The paranasal sinuses and  left mastoid air cells are well-aerated. Other: No significant soft tissue abnormalities are seen. IMPRESSION: 1. No evidence of traumatic intracranial injury or fracture. 2. Moderate cortical volume loss and scattered small vessel ischemic microangiopathy. 3. Partial opacification of the right mastoid air cells and the middle ear. Would correlate for any evidence of right-sided otitis media. Electronically  Signed   By: Garald Balding M.D.   On: 08/26/2016 20:31   Dg Hip Port Unilat With Pelvis 1v Right  Result Date: 08/27/2016 CLINICAL DATA:  Total hip replacement. EXAM: DG HIP (WITH OR WITHOUT PELVIS) 1V PORT RIGHT COMPARISON:  08/26/2016. FINDINGS: Open reduction internal fixation right hip fracture. Slight displacement fracture fragments . Hardware intact. Displaced fractures of the right superior inferior pubic rami again noted. Diffuse osteopenia. IMPRESSION: 1.  ORIF right hip as above. 2. Displaced fracture of the right superior inferior pubic rami again noted. Electronically Signed   By: Marcello Moores  Register   On: 08/27/2016 14:58   Dg Hip Operative Unilat W Or W/o Pelvis Right  Result Date: 08/27/2016 CLINICAL DATA:  83 year old female with a history of hip surgery EXAM: OPERATIVE RIGHT HIP (WITH PELVIS IF PERFORMED)  VIEWS TECHNIQUE: Fluoroscopic spot image(s) were submitted for interpretation post-operatively. COMPARISON:  08/26/2016 FINDINGS: Intraoperative fluoroscopic spot images of right hip. Fluoroscopic images demonstrate open reduction internal fixation of right hip fracture with antegrade intramedullary rod and nail fixation. No immediate complicating features. IMPRESSION: Limited intraoperative fluoroscopic spot images demonstrating ORIF of right hip fracture. No immediate complicating features. Please refer to the dictated operative report for full details of intraoperative findings and procedure. Signed, Dulcy Fanny. Earleen Newport, DO [facility] Radiology Electronically Signed   By: Corrie Mckusick D.O.   On: 08/27/2016 13:22   Dg Hip Unilat With Pelvis 2-3 Views Right  Result Date: 08/26/2016 CLINICAL DATA:  Initial evaluation for acute right hip pain status post recent fall. EXAM: DG HIP (WITH OR WITHOUT PELVIS) 2-3V RIGHT COMPARISON:  None. FINDINGS: There is an acute nondisplaced fracture through the intertrochanteric right hip. Right femoral head  remains normally aligned within the acetabulum. Femoral head height preserved. Additional comminuted fractures involving the right superior pubic ramus with extension towards the pubis symphysis. Additional nondisplaced fracture through the right inferior pubic ramus. On AP projection, there is alternatively, this may reflect an overlying skin fold. Remainder of the bony pelvis grossly intact. SI joints approximated. No acute abnormality about the left hip. Mild degenerative changes noted within the lower lumbar spine. Suture material surgical clips present within the right lower quadrant. No soft tissue abnormality. IMPRESSION: 1. Acute nondisplaced fracture of the intertrochanteric right hip. 2. Additional comminuted fractures of the right superior pubic ramus, with additional fracture involving the right inferior pubic ramus. 3. Question additional subtle nondisplaced fracture through the left superior pubic ramus. Electronically Signed   By: Jeannine Boga M.D.   On: 08/26/2016 20:31   ASSESSMENT AND PLAN:  83 y.o. female with a history of hypertension, hyperlipidemia, diabetes, anemia, dementia now being admitted with:  1. R intertrochanteric hip fracture,mechanical unwitnessed at home -Surgical management per ortho -Patient has stress test done in the past which was negative for ischemia and no cardiac history per son and in her outpatient [facility] chart. -Perioperatively to blockers -Patient is at a low to intermediate risk for surgery. This was discussed with patient's son Timmothy Sours risk and complications discussed -Pain control  2 acute renal failure-resolved with IV fluids -received Gentle IV fluid   3.  HTN -continue lisinopril, perioperative beta blockers  4. HLD-continue Zocor  5. DM-we'll cover with regular insulin sliding scale coverage every 4 hours  6. DVT prophylaxis Lovenox   7. Discharge planning social worker consult Physical therapy recommends rehab  8. UTI po  keflex  Case discussed with Care Management/Social Worker. Management plans discussed with the patient, family and they are in agreement.  CODE STATUS: Full according to son Timmothy Sours for now    TOTAL TIME TAKING CARE OF THIS PATIENT: 30 minutes.  >50% time spent on counselling and coordination of care  POSSIBLE D/C IN 1-2 DAYS, DEPENDING ON CLINICAL CONDITION.  Note: This dictation was prepared with Dragon dictation along with smaller phrase technology. Any transcriptional errors that result from this process are unintentional.  Geriann Lafont M.D on 08/28/2016 at 8:49 AM  Between 7am to 6pm - Pager - [phone]  After 6pm go to [email or web address] - password [email or web address] [facility]  [facility]  Office  [phone]  CC: Primary care physician; Adin Hector, MD

## 2016-08-28 NOTE — Progress Notes (Signed)
Inpatient Diabetes Program Recommendations  AACE/ADA: New Consensus Statement on Inpatient Glycemic Control (2015)  Target Ranges:  Prepandial:   less than 140 mg/dL      [facility] postprandial:   less than 180 mg/dL (1-2 hours)      Critically ill patients:  140 - 180 mg/dL   Results for ILEE, PASTORA (MRN [id number]) as of 08/28/2016 09:52  Ref. Range 08/26/2016 19:41 08/27/2016 00:02 08/27/2016 04:01 08/27/2016 07:13 08/27/2016 11:01 08/27/2016 13:35 08/27/2016 16:29 08/27/2016 21:29 08/28/2016 07:46  Glucose-Capillary Latest Ref Range: 65 - 99 mg/dL 374 (H) 303 (H) 196 (H) 201 (H) 192 (H) 218 (H) 210 (H) 246 (H) 193 (H)   Review of Glycemic Control  Diabetes history: DM2 Outpatient Diabetes medications: None Current orders for Inpatient glycemic control: Novolog 0-9 units TID with meals, Novolog 0-5 units QHS  Inpatient Diabetes Program Recommendations:  Outpatient DM management: Glucose has ranged from 193-246 mg/dl over the past 24 hours. Patient has a hx of DM but does not take any DM medications as an outpatient. Patient likely needs to be taking DM medciation as an outpatient.   Thanks, Barnie Alderman, RN, MSN, CDE Diabetes Coordinator Inpatient Diabetes Program [phone] (Team Pager from 8am to 5pm)

## 2016-08-28 NOTE — Anesthesia Postprocedure Evaluation (Signed)
Anesthesia Post Note  Patient: Autumn Scott  Procedure(s) Performed: Procedure(s) (LRB): INTRAMEDULLARY (IM) NAIL INTERTROCHANTRIC (Right)  Patient location during evaluation: Nursing Unit Anesthesia Type: Spinal Level of consciousness: awake Pain management: pain level controlled Vital Signs Assessment: post-procedure vital signs reviewed and stable Respiratory status: spontaneous breathing, nonlabored ventilation and respiratory function stable Cardiovascular status: blood pressure returned to baseline and stable Postop Assessment: no headache and no backache Anesthetic complications: no     Last Vitals:  Vitals:   08/27/16 2357 08/28/16 0422  BP: (!) 148/71 (!) 146/69  Pulse: 72 66  Resp: 18 16  Temp: 36.4 C 36.8 C    Last Pain:  Vitals:   08/28/16 0422  TempSrc: Oral  PainSc:                  Johnna Acosta

## 2016-08-28 NOTE — Progress Notes (Signed)
Subjective:  Postoperative day 1 status post intramedullary fixation for right intertrochanteric hip fracture. Patient reports pain as mild.  Patient has dementia and is unable to provide a more detailed history.   Objective:   VITALS:   Vitals:   08/28/16 0422 08/28/16 0851 08/28/16 1549 08/28/16 2002  BP: (!) 146/69 126/62 (!) 121/49 (!) 116/51  Pulse: 66 65 75 79  Resp: 16   18  Temp: 98.3 F (36.8 C) 98.2 F (36.8 C) 98 F (36.7 C) 98.1 F (36.7 C)  TempSrc: Oral Oral Oral   SpO2: 98% 100% 98% 98%  Weight:      Height:        PHYSICAL EXAM:  Right lower extremity: Patient's dressing has scant amount of serous sanguinous drainage.  Patient's right thigh is soft and compressible. She can dorsiflex and plantarflex her ankle and flex and extend her toes. She has palpable pedal pulses and intact sensation light touch.   LABS  Results for orders placed or performed during the hospital encounter of 08/26/16 (from the past 24 hour(s))  CBC     Status: Abnormal   Collection Time: 08/28/16  4:45 AM  Result Value Ref Range   WBC 8.3 3.6 - 11.0 K/uL   RBC 3.76 (L) 3.80 - 5.20 MIL/uL   Hemoglobin 11.3 (L) 12.0 - 16.0 g/dL   HCT 33.3 (L) 35.0 - 47.0 %   MCV 88.5 80.0 - 100.0 fL   MCH 30.0 26.0 - 34.0 pg   MCHC 33.9 32.0 - 36.0 g/dL   RDW 15.2 (H) 11.5 - 14.5 %   Platelets 272 150 - 440 K/uL  Basic metabolic panel     Status: Abnormal   Collection Time: 08/28/16  4:45 AM  Result Value Ref Range   Sodium 138 135 - 145 mmol/L   Potassium 3.8 3.5 - 5.1 mmol/L   Chloride 104 101 - 111 mmol/L   CO2 25 22 - 32 mmol/L   Glucose, Bld 208 (H) 65 - 99 mg/dL   BUN 24 (H) 6 - 20 mg/dL   Creatinine, Ser 0.86 0.44 - 1.00 mg/dL   Calcium 8.9 8.9 - 10.3 mg/dL   GFR calc non Af Amer >60 >60 mL/min   GFR calc Af Amer >60 >60 mL/min   Anion gap 9 5 - 15  Glucose, capillary     Status: Abnormal   Collection Time: 08/28/16  7:46 AM  Result Value Ref Range   Glucose-Capillary 193 (H) 65  - 99 mg/dL  Glucose, capillary     Status: Abnormal   Collection Time: 08/28/16 11:44 AM  Result Value Ref Range   Glucose-Capillary 189 (H) 65 - 99 mg/dL  Glucose, capillary     Status: Abnormal   Collection Time: 08/28/16  4:47 PM  Result Value Ref Range   Glucose-Capillary 209 (H) 65 - 99 mg/dL  Glucose, capillary     Status: Abnormal   Collection Time: 08/28/16 10:03 PM  Result Value Ref Range   Glucose-Capillary 268 (H) 65 - 99 mg/dL   Comment 1 Notify RN     Dg Hip Port Unilat With Pelvis 1v Right  Result Date: 08/27/2016 CLINICAL DATA:  Total hip replacement. EXAM: DG HIP (WITH OR WITHOUT PELVIS) 1V PORT RIGHT COMPARISON:  08/26/2016. FINDINGS: Open reduction internal fixation right hip fracture. Slight displacement fracture fragments . Hardware intact. Displaced fractures of the right superior inferior pubic rami again noted. Diffuse osteopenia. IMPRESSION: 1.  ORIF right hip as above. 2.  Displaced fracture of the right superior inferior pubic rami again noted. Electronically Signed   By: Marcello Moores  Register   On: 08/27/2016 14:58   Dg Hip Operative Unilat W Or W/o Pelvis Right  Result Date: 08/27/2016 CLINICAL DATA:  83 year old female with a history of hip surgery EXAM: OPERATIVE RIGHT HIP (WITH PELVIS IF PERFORMED)  VIEWS TECHNIQUE: Fluoroscopic spot image(s) were submitted for interpretation post-operatively. COMPARISON:  08/26/2016 FINDINGS: Intraoperative fluoroscopic spot images of right hip. Fluoroscopic images demonstrate open reduction internal fixation of right hip fracture with antegrade intramedullary rod and nail fixation. No immediate complicating features. IMPRESSION: Limited intraoperative fluoroscopic spot images demonstrating ORIF of right hip fracture. No immediate complicating features. Please refer to the dictated operative report for full details of intraoperative findings and procedure. Signed, Dulcy Fanny. Earleen Newport, DO [facility]  [facility] Radiology Electronically Signed   By: Corrie Mckusick D.O.   On: 08/27/2016 13:22    Assessment/Plan: 1 Day Post-Op   Active Problems:   Closed intertrochanteric fracture of hip, right, initial encounter [facility])  Patient is stable postop. Recheck labs in the a.m. Continue physical therapy. Patient will need SNF upon discharge.    Thornton Park , MD 08/28/2016, 10:12 PM

## 2016-08-28 NOTE — Progress Notes (Signed)
PT is recommending SNF. Clinical Social Worker (CSW) met with patient and her son Timmothy Sours to present bed offers. [facility]. [facility] liaison is aware of accepted bed offer. CSW discussed long term care options with son and gave him information on how to apply for medicaid. CSW will continue to follow and assist as needed.   [employer], LCSW [phone]
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
## 2016-08-28 NOTE — Evaluation (Signed)
Occupational Therapy Evaluation Patient Details Name: Autumn Scott MRN: [id number] DOB: October 30, 1931 Today's Date: 08/28/2016    History of Present Illness Pt admitted for R hip fx s/p fall and now is POD 1 from IM nailing. Pt also with fx of R sup/inf pubic rami. Pt with history of HTN, DM, anemia, and dementia.    Clinical Impression   83yo female pt presenting with pain, decreased activity tolerance, fatigue, and increased need for assistance with self care tasks secondary to R hip fx s/p fall and POD1 from IM nailing. Pt requires skilled OT services to address noted impairments in order to address independence with self care tasks and minimize risk of future falls. Recommending SNF placement for rehabilitation and consideration of longer term care by family as noted by pt's son.    Follow Up Recommendations  SNF    Equipment Recommendations       Recommendations for Other Services       Precautions / Restrictions Precautions Precautions: Fall Restrictions Weight Bearing Restrictions: Yes RLE Weight Bearing: Weight bearing as tolerated      Mobility Bed Mobility Overal bed mobility: Needs Assistance Bed Mobility: Sit to Supine     Supine to sit: Mod assist Sit to supine: Mod assist   General bed mobility comments: pt up in chair for OT assessment  Transfers Overall transfer level: Needs assistance Equipment used: Rolling walker (2 wheeled) Transfers: Sit to/from Stand Sit to Stand: Mod assist         General transfer comment: pt up in chair for OT session, had just worked with PT, did not attempt transfers due to fatigue, pain, and safety    Balance Overall balance assessment: History of Falls Sitting-balance support: Feet supported;Single extremity supported Sitting balance-Leahy Scale: Fair   Postural control: Posterior lean Standing balance support: Bilateral upper extremity supported;During functional activity Standing balance-Leahy Scale:  Poor Standing balance comment: Pt relies on RW for support for static and dynamic activities                            ADL Overall ADL's : Needs assistance/impaired Eating/Feeding: Set up;Bed level   Grooming: Bed level;Set up   Upper Body Bathing: Minimal assistance;Bed level   Lower Body Bathing: Maximal assistance;Bed level   Upper Body Dressing : Set up;Supervision/safety   Lower Body Dressing: Maximal assistance;Bed level;Adhering to hip precautions;Cueing for compensatory techniques     Toilet Transfer Details (indicate cue type and reason): did not attempt at this time due to pt fatigue, pain, and safety having recently been up with PT         Functional mobility during ADLs:  (continue to assess functional mobility during ADLs)       Vision Vision Assessment?: No apparent visual deficits   Perception     Praxis      Pertinent Vitals/Pain Pain Assessment: 0-10 Pain Score: 5  (pt unable to provide number but reported "medium" amount of pain) Faces Pain Scale: Hurts even more Pain Location: R hip Pain Descriptors / Indicators: Operative site guarding;Moaning Pain Intervention(s): Limited activity within patient's tolerance;Monitored during session     Hand Dominance     Extremity/Trunk Assessment Upper Extremity Assessment Upper Extremity Assessment: Overall WFL for tasks assessed   Lower Extremity Assessment Lower Extremity Assessment: Defer to PT evaluation       Communication Communication Communication: No difficulties   Cognition Arousal/Alertness: Awake/alert Behavior During Therapy: [facility] for tasks  assessed/performed Overall Cognitive Status: History of cognitive impairments - at baseline                 General Comments: somewhat confused, poor historian   General Comments       Exercises Exercises: Total Joint Other Exercises Other Exercises: supine ther-ex performed on B LE including ankle pumps, SLRs, and hip ab/ad.  All ther-ex performed x 10 reps with min/mod assist for sequencing   Shoulder Instructions      Home Living Family/patient expects to be discharged to:: Private residence Living Arrangements: Children Available Help at Discharge: Available PRN/intermittently;Family Type of Home: House (per son's report; pt is poor historian) Home Access: Ramped entrance     Home Layout: Able to live on main level with bedroom/bathroom     Bathroom Shower/Tub: Tub/shower unit;Curtain Shower/tub characteristics: Architectural technologist: Standard     Home Equipment: Environmental consultant - 2 wheels;Shower seat;Wheelchair - manual   Additional Comments: Pt is poor historian, spoke to adult son for additional information on home living situation. Son also reports that he and other family are considering SNF placement for pt long term due to decreased availability of assistance at home      Prior Functioning/Environment Level of Independence: Needs assistance  Gait / Transfers Assistance Needed: assistance from family for tub transfers, unclear extent of assistance ADL's / Homemaking Assistance Needed: PRN assistance   Comments: Pt is poor historian, son provided information        OT Problem List: Decreased strength;Pain;Decreased range of motion;Decreased activity tolerance;Decreased safety awareness;Decreased knowledge of use of DME or AE;Decreased knowledge of precautions   OT Treatment/Interventions: Self-care/ADL training;Therapeutic exercise;Energy conservation;DME and/or AE instruction    OT Goals(Current goals can be found in the care plan section) Acute Rehab OT Goals Patient Stated Goal: get better OT Goal Formulation: With patient Time For Goal Achievement: 09/11/16 Potential to Achieve Goals: Good  OT Frequency: Min 1X/week   Barriers to D/C: Decreased caregiver support  family only able to provide assist PRN, morning and evening so pt is alone at home during the day       Co-evaluation               End of Session    Activity Tolerance: Patient limited by fatigue;Patient limited by pain Patient left: in chair;with call bell/phone within reach;with chair alarm set   Time: 1007-1030 OT Time Calculation (min): 23 min Charges:  OT General Charges $OT Visit: 1 Procedure OT Evaluation $OT Eval Low Complexity: 1 Procedure G-Codes:    Corky Sox, OTR/L 08/28/2016, 3:19 PM

## 2016-08-29 LAB — BASIC METABOLIC PANEL
Anion gap: 7 (ref 5–15)
BUN: 23 mg/dL — ABNORMAL HIGH (ref 6–20)
CALCIUM: 8.7 mg/dL — AB (ref 8.9–10.3)
CO2: 26 mmol/L (ref 22–32)
Chloride: 104 mmol/L (ref 101–111)
Creatinine, Ser: 0.78 mg/dL (ref 0.44–1.00)
GLUCOSE: 167 mg/dL — AB (ref 65–99)
POTASSIUM: 3.3 mmol/L — AB (ref 3.5–5.1)
SODIUM: 137 mmol/L (ref 135–145)

## 2016-08-29 LAB — CBC
HCT: 28.5 % — ABNORMAL LOW (ref 35.0–47.0)
Hemoglobin: 9.8 g/dL — ABNORMAL LOW (ref 12.0–16.0)
MCH: 30.1 pg (ref 26.0–34.0)
MCHC: 34.5 g/dL (ref 32.0–36.0)
MCV: 87.2 fL (ref 80.0–100.0)
PLATELETS: 258 10*3/uL (ref 150–440)
RBC: 3.26 MIL/uL — AB (ref 3.80–5.20)
RDW: 15 % — AB (ref 11.5–14.5)
WBC: 8 10*3/uL (ref 3.6–11.0)

## 2016-08-29 LAB — GLUCOSE, CAPILLARY
GLUCOSE-CAPILLARY: 246 mg/dL — AB (ref 65–99)
GLUCOSE-CAPILLARY: 297 mg/dL — AB (ref 65–99)

## 2016-08-29 MED ORDER — ENOXAPARIN SODIUM 30 MG/0.3ML ~~LOC~~ SOLN: 30.0000 mg | SUBCUTANEOUS | 0 refills | Status: DC

## 2016-08-29 MED ORDER — ENSURE ENLIVE PO LIQD: 237.0000 mL | Freq: Two times a day (BID) | ORAL | 12 refills | Status: DC

## 2016-08-29 MED ORDER — METOPROLOL TARTRATE 25 MG PO TABS: 12.5000 mg | ORAL_TABLET | Freq: Two times a day (BID) | ORAL | 0 refills | Status: DC

## 2016-08-29 MED ORDER — FERROUS SULFATE 325 (65 FE) MG PO TABS: 325.0000 mg | ORAL_TABLET | Freq: Three times a day (TID) | ORAL | 3 refills | Status: DC

## 2016-08-29 MED ORDER — ENOXAPARIN SODIUM 40 MG/0.4ML ~~LOC~~ SOLN
40.0000 mg | SUBCUTANEOUS | Status: DC
  Administered 2016-08-29: 40 mg via SUBCUTANEOUS
  Filled 2016-08-29: qty 0.4

## 2016-08-29 MED ORDER — OXYCODONE HCL 5 MG PO TABS: 5.0000 mg | ORAL_TABLET | ORAL | 0 refills | Status: DC | PRN

## 2016-08-29 MED ORDER — CEPHALEXIN 500 MG PO CAPS: 500.0000 mg | ORAL_CAPSULE | Freq: Two times a day (BID) | ORAL | 0 refills | Status: DC

## 2016-08-29 NOTE — Discharge Summary (Addendum)
[facility] [place] NAME: Autumn Scott    MR#:  [id number]  DATE OF BIRTH:  [birth year]  DATE OF ADMISSION:  08/26/2016 ADMITTING PHYSICIAN: Harvie Bridge, DO  DATE OF DISCHARGE: 08/29/16  PRIMARY CARE PHYSICIAN: Tama High III, MD    ADMISSION DIAGNOSIS:  Closed displaced intertrochanteric fracture of right femur, initial encounter ([place]) [S72.141A] Hip pain, acute, right [M25.551] Closed fracture of multiple pubic rami, right, initial encounter ([place]) [S32.591A]  DISCHARGE DIAGNOSIS:  Acute right hip fracture s/p surgery Acute right pubic rami fracture dementia UTi SECONDARY DIAGNOSIS:   Past Medical History:  Diagnosis Date  . Anxiety   . Cancer [facility])    uterine cancer therefore had a hysterectomy  . High cholesterol   . Hypertension   . Hypothyroidism   . Neuropathy ([place])   . Thyroid disease     HOSPITAL COURSE:   83 y.o.femalewith a history of hypertension, hyperlipidemia, diabetes, anemia, dementianow being admitted with:  1. R intertrochanteric hip fracture,mechanical unwitnessed at home -Surgical management per ortho  Appreciate Dr Harden Mo help --Pain control -pod #2 -doing well  2 acute renal failure-resolved with IV fluids -received Gentle IV fluid   3. HTN -continue lisinopril, perioperative beta blockers  4. HLD-continue Zocor  5. DM-we'll cover with regular insulin sliding scale coverage every 4 hours  6. DVT prophylaxis Lovenox 14 days per ortho  7. UTI on po keflex  D/c to rehab today CONSULTS OBTAINED:  Treatment Team:  Thornton Park, MD  DRUG ALLERGIES:  No Known Allergies  DISCHARGE MEDICATIONS:   Current Discharge Medication List    START taking these medications   Details  cephALEXin (KEFLEX) 500 MG capsule Take 1 capsule (500 mg total) by mouth every 12 (twelve) hours. Qty: 12 capsule, Refills: 0    enoxaparin (LOVENOX) 30 MG/0.3ML injection Inject 0.3  mLs (30 mg total) into the skin daily. Qty: 14 Syringe, Refills: 0    feeding supplement, ENSURE ENLIVE, (ENSURE ENLIVE) LIQD Take 237 mLs by mouth 2 (two) times daily between meals. Qty: 237 mL, Refills: 12    ferrous sulfate 325 (65 FE) MG tablet Take 1 tablet (325 mg total) by mouth 3 (three) times daily after meals. Qty: 90 tablet, Refills: 3    metoprolol tartrate (LOPRESSOR) 25 MG tablet Take 0.5 tablets (12.5 mg total) by mouth 2 (two) times daily. Qty: 60 tablet, Refills: 0    oxyCODONE (OXY IR/ROXICODONE) 5 MG immediate release tablet Take 1-2 tablets (5-10 mg total) by mouth every 4 (four) hours as needed for breakthrough pain ((for MODERATE breakthrough pain)). Qty: 30 tablet, Refills: 0      CONTINUE these medications which have NOT CHANGED   Details  gabapentin (NEURONTIN) 600 MG tablet Take 600 mg by mouth every 6 (six) hours.    levothyroxine (SYNTHROID, LEVOTHROID) 100 MCG tablet Take 100 mcg by mouth daily before breakfast.    lisinopril (PRINIVIL,ZESTRIL) 10 MG tablet Take 10 mg by mouth daily.    simvastatin (ZOCOR) 40 MG tablet Take 40 mg by mouth daily at 6 PM.        If you experience worsening of your admission symptoms, develop shortness of breath, life threatening emergency, suicidal or homicidal thoughts you must seek medical attention immediately by calling 911 or calling your MD immediately  if symptoms less severe.  You Must read complete instructions/literature along with all the possible adverse reactions/side effects for all the Medicines you take and that have  been prescribed to you. Take any new Medicines after you have completely understood and accept all the possible adverse reactions/side effects.   Please note  You were cared for by a hospitalist during your hospital stay. If you have any questions about your discharge medications or the care you received while you were in the hospital after you are discharged, you can call the unit and asked  to speak with the hospitalist on call if the hospitalist that took care of you is not available. Once you are discharged, your primary care physician will handle any further medical issues. Please note that NO REFILLS for any discharge medications will be authorized once you are discharged, as it is imperative that you return to your primary care physician (or establish a relationship with a primary care physician if you do not have one) for your aftercare needs so that they can reassess your need for medications and monitor your lab values. Today   SUBJECTIVE   Doing well  VITAL SIGNS:  Blood pressure (!) 115/54, pulse (!) 59, temperature 98.4 F (36.9 C), resp. rate 16, height 5' (1.524 m), weight 49.9 kg (110 lb), SpO2 99 %.  I/O:   Intake/Output Summary (Last 24 hours) at 08/29/16 0806 Last data filed at 08/29/16 0355  Gross per 24 hour  Intake              240 ml  Output                0 ml  Net              240 ml    PHYSICAL EXAMINATION:  GENERAL:  83 y.o.-year-old patient lying in the bed with no acute distress.  EYES: Pupils equal, round, reactive to light and accommodation. No scleral icterus. Extraocular muscles intact.  HEENT: Head atraumatic, normocephalic. Oropharynx and nasopharynx clear.  NECK:  Supple, no jugular venous distention. No thyroid enlargement, no tenderness.  LUNGS: Normal breath sounds bilaterally, no wheezing, rales,rhonchi or crepitation. No use of accessory muscles of respiration.  CARDIOVASCULAR: S1, S2 normal. No murmurs, rubs, or gallops.  ABDOMEN: Soft, non-tender, non-distended. Bowel sounds present. No organomegaly or mass.  EXTREMITIES: No pedal edema, cyanosis, or clubbing. Hip surgical scar normal NEUROLOGIC: Cranial nerves II through XII are intact. Muscle strength 4/5 in all extremities. Sensation intact. Gait not checked.  PSYCHIATRIC:patient is alert and pleasantly confused SKIN: No obvious rash, lesion, or ulcer.   DATA REVIEW:   CBC    Recent Labs Lab 08/29/16 0454  WBC 8.0  HGB 9.8*  HCT 28.5*  PLT 258    Chemistries   Recent Labs Lab 08/26/16 1954  08/29/16 0454  NA 138  < > 137  K 3.5  < > 3.3*  CL 102  < > 104  CO2 29  < > 26  GLUCOSE 433*  < > 167*  BUN 43*  < > 23*  CREATININE 1.25*  < > 0.78  CALCIUM 10.1  < > 8.7*  AST 27  --   --   ALT 22  --   --   ALKPHOS 51  --   --   BILITOT 1.0  --   --   < > = values in this interval not displayed.  Microbiology Results   Recent Results (from the past 240 hour(s))  Surgical PCR screen     Status: None   Collection Time: 08/26/16 11:28 PM  Result Value Ref Range Status   MRSA,  PCR NEGATIVE NEGATIVE Final   Staphylococcus aureus NEGATIVE NEGATIVE Final    Comment:        The Xpert SA Assay (FDA approved for NASAL specimens in patients over 70 years of age), is one component of a comprehensive surveillance program.  Test performance has been validated by [facility] for patients greater than or equal to 70 year old. It is not intended to diagnose infection nor to guide or monitor treatment.   Urine culture     Status: Abnormal   Collection Time: 08/27/16 10:30 AM  Result Value Ref Range Status   Specimen Description URINE, RANDOM  Final   Special Requests NONE  Final   Culture MULTIPLE SPECIES PRESENT, SUGGEST RECOLLECTION (A)  Final   Report Status 08/28/2016 FINAL  Final    RADIOLOGY:  Dg Hip Port Unilat With Pelvis 1v Right  Result Date: 08/27/2016 CLINICAL DATA:  Total hip replacement. EXAM: DG HIP (WITH OR WITHOUT PELVIS) 1V PORT RIGHT COMPARISON:  08/26/2016. FINDINGS: Open reduction internal fixation right hip fracture. Slight displacement fracture fragments . Hardware intact. Displaced fractures of the right superior inferior pubic rami again noted. Diffuse osteopenia. IMPRESSION: 1.  ORIF right hip as above. 2. Displaced fracture of the right superior inferior pubic rami again noted. Electronically Signed   By: Marcello Moores  Register    On: 08/27/2016 14:58   Dg Hip Operative Unilat W Or W/o Pelvis Right  Result Date: 08/27/2016 CLINICAL DATA:  83 year old female with a history of hip surgery EXAM: OPERATIVE RIGHT HIP (WITH PELVIS IF PERFORMED)  VIEWS TECHNIQUE: Fluoroscopic spot image(s) were submitted for interpretation post-operatively. COMPARISON:  08/26/2016 FINDINGS: Intraoperative fluoroscopic spot images of right hip. Fluoroscopic images demonstrate open reduction internal fixation of right hip fracture with antegrade intramedullary rod and nail fixation. No immediate complicating features. IMPRESSION: Limited intraoperative fluoroscopic spot images demonstrating ORIF of right hip fracture. No immediate complicating features. Please refer to the dictated operative report for full details of intraoperative findings and procedure. Signed, Dulcy Fanny. Earleen Newport, DO [facility] Electronically Signed   By: Corrie Mckusick D.O.   On: 08/27/2016 13:22     Management plans discussed with the patient, family and they are in agreement.  CODE STATUS:     Code Status Orders        Start     Ordered   08/26/16 2331  Full code  Continuous     08/26/16 2330    Code Status History    Date Active Date Inactive Code Status Order ID Comments User Context   08/26/2016  9:55 PM 08/26/2016  9:55 PM Full Code [id number]  Thornton Park, MD ED      TOTAL TIME TAKING CARE OF THIS PATIENT: 40 minutes.    Norfleet Capers M.D on 08/29/2016 at 8:06 AM  Between 7am to 6pm - Pager - [phone] After 6pm go to [email or web address] - password [email or web address] [facility]  [facility]  Office  [phone]  CC: Primary care physician; Adin Hector, MD

## 2016-08-29 NOTE — Progress Notes (Signed)
Physical Therapy Treatment Patient Details Name: Autumn Scott MRN: [id number] DOB: 05-29-1932 Today's Date: 08/29/2016    History of Present Illness Pt admitted for R hip fx s/p fall and now is POD 1 from IM nailing. Pt also with fx of R sup/inf pubic rami. Pt with history of HTN, DM, anemia, and dementia.     PT Comments    Pt in bed, denies pain initially.  Participated in exercises as described below.  To edge of bed with mod a x 1.  Pt reported increased pain after movement and was hesitant to weight bear on RLE this am.  Stood with +2 mod assist to ambulate 5' to chair at bedside.  Pt with post lean and required increased assist with decreased gait quality.  Primary nurse in and pain medication was requested.  Assisted RN with transfer to/from bedside commode with mod a x 2.  Generally overall decrease in mobility and increased assist today.  Confusion at baseline.   Follow Up Recommendations  SNF     Equipment Recommendations       Recommendations for Other Services       Precautions / Restrictions Precautions Precautions: Fall Restrictions Weight Bearing Restrictions: Yes RLE Weight Bearing: Weight bearing as tolerated    Mobility  Bed Mobility Overal bed mobility: Needs Assistance Bed Mobility: Sit to Supine     Supine to sit: Mod assist     General bed mobility comments: initially declined pain, but once sitting edge of bed said pain increased.  Transfers Overall transfer level: Needs assistance Equipment used: Rolling walker (2 wheeled) Transfers: Sit to/from Stand              Ambulation/Gait Ambulation/Gait assistance: Min assist;Mod assist;+2 physical assistance Ambulation Distance (Feet): 5 Feet Assistive device: Rolling walker (2 wheeled) Gait Pattern/deviations: Step-to pattern;Decreased step length - right;Decreased step length - left Gait velocity: decreased   General Gait Details: poor stepping today with post lean.  sliding feet at  times.   Stairs            Wheelchair Mobility    Modified Rankin (Stroke Patients Only)       Balance Overall balance assessment: History of Falls Sitting-balance support: Feet supported;Single extremity supported Sitting balance-Leahy Scale: Fair   Postural control: Posterior lean Standing balance support: Bilateral upper extremity supported;During functional activity Standing balance-Leahy Scale: Poor Standing balance comment: Pt relies on RW for support for static and dynamic activities, post lean with standing today                    Cognition Arousal/Alertness: Awake/alert Behavior During Therapy: WFL for tasks assessed/performed Overall Cognitive Status: History of cognitive impairments - at baseline                 General Comments: somewhat confused, poor historian    Exercises Other Exercises Other Exercises: supine ther-ex performed on B LE including ankle pumps, SLRs, and hip ab/ad. All ther-ex performed x 10 reps with min/mod assist for sequencing Other Exercises: transfer to/from bedside commode with min/mod a x 2    General Comments        Pertinent Vitals/Pain Faces Pain Scale: Hurts even more Pain Location: R hip Pain Descriptors / Indicators: Operative site guarding Pain Intervention(s): Limited activity within patient's tolerance;Patient requesting pain meds-RN notified    Home Living                      Prior  Function            PT Goals (current goals can now be found in the care plan section) Progress towards PT goals: Progressing toward goals    Frequency    BID      PT Plan Current plan remains appropriate    Co-evaluation             End of Session Equipment Utilized During Treatment: Gait belt Activity Tolerance: Patient limited by pain Patient left: in chair;with call bell/phone within reach;with chair alarm set;with nursing/sitter in room     Time: [id number] PT Time Calculation  (min) (ACUTE ONLY): 25 min  Charges:  $Therapeutic Exercise: 8-22 mins $Therapeutic Activity: 8-22 mins                    G Codes:      Autumn Scott 09-11-2016, 9:22 AM

## 2016-08-29 NOTE — Progress Notes (Signed)
Pt has been unable to void. Bladder scan completed for >540.  I/O cath completed with 425 amber urine. Pt tolerated well.

## 2016-08-29 NOTE — Progress Notes (Signed)
Patient is medically stable for discharge today to [facility]. Per, [facility] admissions coordinator at [facility], patient can come to room 509. Clinical Education officer, museum (CSW) sent discharge orders in [place]. Social work Theatre manager met with patient at bedside and explained that patient will discharge today to [facility]. Patient verbally agreed she understood. Social work Theatre manager spoke with patient's son, Timmothy Sours, making him aware of patient's discharge today. Patient's son verbally agreed her understood. RN will call report and arrange EMS for transport. Please re-consult of future social work needs arise. Social work Architectural technologist off.   Danie Chandler, Social Work Intern  [phone]

## 2016-08-29 NOTE — Progress Notes (Signed)
Anticoagulation monitoring(Lovenox):  84yo  F ordered Lovenox 30 mg Q24h  Filed Weights   08/26/16 1947 08/26/16 2313 08/27/16 1107  Weight: 140 lb (63.5 kg) 111 lb 8 oz (50.6 kg) 110 lb (49.9 kg)   BMI 21.5   Lab Results  Component Value Date   CREATININE 0.78 08/29/2016   CREATININE 0.86 08/28/2016   CREATININE 0.82 08/27/2016   Estimated Creatinine Clearance: 38.9 mL/min (by C-G formula based on SCr of 0.78 mg/dL). Hemoglobin & Hematocrit     Component Value Date/Time   HGB 9.8 (L) 08/29/2016 0454   HGB 10.6 (L) 07/07/2012 1706   HCT 28.5 (L) 08/29/2016 0454   HCT 31.0 (L) 07/07/2012 1706     Per Protocol for Patient with estCrcl > 30 ml/min and BMI < 40, will transition to Lovenox 40 mg Q24h      Chinita Greenland PharmD Clinical Pharmacist 08/29/2016

## 2016-08-29 NOTE — Care Management Important Message (Signed)
Important Message  Patient Details  Name: Autumn Scott MRN: [id number] Date of Birth: [birth year]   Medicare Important Message Given:  Yes    Jolly Mango, RN 08/29/2016, 10:14 AM

## 2016-08-29 NOTE — Progress Notes (Signed)
Notified Dr Marcille Blanco of bladder scan results. Monitor pt a little longer to see if she voids.

## 2016-08-29 NOTE — Clinical Social Work Placement (Signed)
   CLINICAL SOCIAL WORK PLACEMENT  NOTE  Date:  08/29/2016  Patient Details  Name: Autumn Scott MRN: [id number] Date of Birth: 1932/01/05  Clinical Social Work is seeking post-discharge placement for this patient at the [facility] level of care (*CSW will initial, date and re-position this form in  chart as items are completed):  Yes   Patient/family provided with [facility] Work Department's list of facilities offering this level of care within the geographic area requested by the patient (or if unable, by the patient's family).  Yes   Patient/family informed of their freedom to choose among providers that offer the needed level of care, that participate in Medicare, Medicaid or managed care program needed by the patient, have an available bed and are willing to accept the patient.  Yes   Patient/family informed of [facility]'s ownership interest in [facility] and [facility], as well as of the fact that they are under no obligation to receive care at these facilities.  PASRR submitted to EDS on 08/27/16     PASRR number received on 08/27/16     Existing PASRR number confirmed on       FL2 transmitted to all facilities in geographic area requested by pt/family on 08/27/16     FL2 transmitted to all facilities within larger geographic area on       Patient informed that his/her managed care company has contracts with or will negotiate with certain facilities, including the following:        Yes   Patient/family informed of bed offers received.  Patient chooses bed at  ([facility] )     Physician recommends and patient chooses bed at      Patient to be transferred to  ([facility] ) on 08/29/16.  Patient to be transferred to facility by  [facility] )     Patient family notified on 08/29/16 of transfer.  Name of family member notified:   (Patient's son Timmothy Sours is aware of D/C today. )     PHYSICIAN       Additional Comment:     _______________________________________________ Shaheim Mahar, Veronia Beets, LCSW 08/29/2016, 11:30 AM

## 2016-08-29 NOTE — Progress Notes (Signed)
Subjective:  Postoperative day #2 status post intramedullary fixation for right intertrochanteric hip fracture. Patient reports pain as mild.  Patient is scheduled to be discharged to SNF today. Her son is at the bedside.  Objective:   VITALS:   Vitals:   08/28/16 1549 08/28/16 2002 08/29/16 0353 08/29/16 0954  BP: (!) 121/49 (!) 116/51 (!) 115/54 (!) 118/49  Pulse: 75 79 (!) 59 77  Resp:  18 16   Temp: 98 F (36.7 C) 98.1 F (36.7 C) 98.4 F (36.9 C)   TempSrc: Oral     SpO2: 98% 98% 99% 97%  Weight:      Height:        PHYSICAL EXAM:  Right lower extremity: Sensation intact distally Intact pulses distally Dorsiflexion/Plantar flexion intact Incision: no drainage No cellulitis present Compartment soft  LABS  Results for orders placed or performed during the hospital encounter of 08/26/16 (from the past 24 hour(s))  Glucose, capillary     Status: Abnormal   Collection Time: 08/28/16  4:47 PM  Result Value Ref Range   Glucose-Capillary 209 (H) 65 - 99 mg/dL  Glucose, capillary     Status: Abnormal   Collection Time: 08/28/16 10:03 PM  Result Value Ref Range   Glucose-Capillary 268 (H) 65 - 99 mg/dL   Comment 1 Notify RN   CBC     Status: Abnormal   Collection Time: 08/29/16  4:54 AM  Result Value Ref Range   WBC 8.0 3.6 - 11.0 K/uL   RBC 3.26 (L) 3.80 - 5.20 MIL/uL   Hemoglobin 9.8 (L) 12.0 - 16.0 g/dL   HCT 28.5 (L) 35.0 - 47.0 %   MCV 87.2 80.0 - 100.0 fL   MCH 30.1 26.0 - 34.0 pg   MCHC 34.5 32.0 - 36.0 g/dL   RDW 15.0 (H) 11.5 - 14.5 %   Platelets 258 150 - 440 K/uL  Basic metabolic panel     Status: Abnormal   Collection Time: 08/29/16  4:54 AM  Result Value Ref Range   Sodium 137 135 - 145 mmol/L   Potassium 3.3 (L) 3.5 - 5.1 mmol/L   Chloride 104 101 - 111 mmol/L   CO2 26 22 - 32 mmol/L   Glucose, Bld 167 (H) 65 - 99 mg/dL   BUN 23 (H) 6 - 20 mg/dL   Creatinine, Ser 0.78 0.44 - 1.00 mg/dL   Calcium 8.7 (L) 8.9 - 10.3 mg/dL   GFR calc non Af  Amer >60 >60 mL/min   GFR calc Af Amer >60 >60 mL/min   Anion gap 7 5 - 15  Glucose, capillary     Status: Abnormal   Collection Time: 08/29/16  9:21 AM  Result Value Ref Range   Glucose-Capillary 246 (H) 65 - 99 mg/dL   Comment 1 Notify RN   Glucose, capillary     Status: Abnormal   Collection Time: 08/29/16 11:20 AM  Result Value Ref Range   Glucose-Capillary 297 (H) 65 - 99 mg/dL   Comment 1 Notify RN     Dg Hip Port Unilat With Pelvis 1v Right  Result Date: 08/27/2016 CLINICAL DATA:  Total hip replacement. EXAM: DG HIP (WITH OR WITHOUT PELVIS) 1V PORT RIGHT COMPARISON:  08/26/2016. FINDINGS: Open reduction internal fixation right hip fracture. Slight displacement fracture fragments . Hardware intact. Displaced fractures of the right superior inferior pubic rami again noted. Diffuse osteopenia. IMPRESSION: 1.  ORIF right hip as above. 2. Displaced fracture of the right superior inferior  pubic rami again noted. Electronically Signed   By: Marcello Moores  Register   On: 08/27/2016 14:58    Assessment/Plan: 2 Days Post-Op   Active Problems:   Closed intertrochanteric fracture of hip, right, initial encounter [facility])  Patient has been well postop. She is scheduled to SNF. She is being discharged to [facility]. She'll follow up my office in 10-14 days. She is weightbearing as tolerated on the right lower extremity.  Continue anticoagulation therapy for 4-6 weeks.    Thornton Park , MD 08/29/2016, 1:19 PM

## 2018-03-19 ENCOUNTER — Encounter: Payer: Self-pay | Admitting: Emergency Medicine

## 2018-03-19 ENCOUNTER — Emergency Department: Payer: Medicare Other

## 2018-03-19 ENCOUNTER — Observation Stay
Admission: EM | Admit: 2018-03-19 | Discharge: 2018-03-20 | Payer: Medicare Other | Attending: Internal Medicine | Admitting: Internal Medicine

## 2018-03-19 ENCOUNTER — Other Ambulatory Visit: Payer: Self-pay

## 2018-03-19 DIAGNOSIS — Z8542 Personal history of malignant neoplasm of other parts of uterus: Secondary | ICD-10-CM | POA: Diagnosis not present

## 2018-03-19 DIAGNOSIS — E039 Hypothyroidism, unspecified: Secondary | ICD-10-CM | POA: Diagnosis not present

## 2018-03-19 DIAGNOSIS — S2242XA Multiple fractures of ribs, left side, initial encounter for closed fracture: Secondary | ICD-10-CM | POA: Diagnosis present

## 2018-03-19 DIAGNOSIS — R2689 Other abnormalities of gait and mobility: Secondary | ICD-10-CM | POA: Diagnosis not present

## 2018-03-19 DIAGNOSIS — Z882 Allergy status to sulfonamides status: Secondary | ICD-10-CM | POA: Diagnosis not present

## 2018-03-19 DIAGNOSIS — F0390 Unspecified dementia without behavioral disturbance: Secondary | ICD-10-CM | POA: Insufficient documentation

## 2018-03-19 DIAGNOSIS — E1140 Type 2 diabetes mellitus with diabetic neuropathy, unspecified: Secondary | ICD-10-CM | POA: Insufficient documentation

## 2018-03-19 DIAGNOSIS — E7800 Pure hypercholesterolemia, unspecified: Secondary | ICD-10-CM | POA: Insufficient documentation

## 2018-03-19 DIAGNOSIS — I700 Atherosclerosis of aorta: Secondary | ICD-10-CM | POA: Insufficient documentation

## 2018-03-19 DIAGNOSIS — Z79899 Other long term (current) drug therapy: Secondary | ICD-10-CM | POA: Insufficient documentation

## 2018-03-19 DIAGNOSIS — Z87891 Personal history of nicotine dependence: Secondary | ICD-10-CM | POA: Diagnosis not present

## 2018-03-19 DIAGNOSIS — I10 Essential (primary) hypertension: Secondary | ICD-10-CM | POA: Insufficient documentation

## 2018-03-19 DIAGNOSIS — Y92091 Bathroom in other non-institutional residence as the place of occurrence of the external cause: Secondary | ICD-10-CM | POA: Diagnosis not present

## 2018-03-19 DIAGNOSIS — S2249XA Multiple fractures of ribs, unspecified side, initial encounter for closed fracture: Secondary | ICD-10-CM | POA: Diagnosis present

## 2018-03-19 DIAGNOSIS — W010XXA Fall on same level from slipping, tripping and stumbling without subsequent striking against object, initial encounter: Secondary | ICD-10-CM | POA: Insufficient documentation

## 2018-03-19 DIAGNOSIS — F419 Anxiety disorder, unspecified: Secondary | ICD-10-CM | POA: Insufficient documentation

## 2018-03-19 HISTORY — DX: Type 2 diabetes mellitus without complications: E11.9

## 2018-03-19 LAB — CBC WITH DIFFERENTIAL/PLATELET
BASOS ABS: 0 10*3/uL (ref 0–0.1)
BASOS PCT: 0 %
EOS ABS: 0 10*3/uL (ref 0–0.7)
EOS PCT: 1 %
HEMATOCRIT: 35.5 % (ref 35.0–47.0)
Hemoglobin: 12 g/dL (ref 12.0–16.0)
Lymphocytes Relative: 8 %
Lymphs Abs: 0.5 10*3/uL — ABNORMAL LOW (ref 1.0–3.6)
MCH: 29.9 pg (ref 26.0–34.0)
MCHC: 33.7 g/dL (ref 32.0–36.0)
MCV: 88.5 fL (ref 80.0–100.0)
MONO ABS: 0.4 10*3/uL (ref 0.2–0.9)
MONOS PCT: 6 %
NEUTROS ABS: 5.8 10*3/uL (ref 1.4–6.5)
Neutrophils Relative %: 85 %
PLATELETS: 201 10*3/uL (ref 150–440)
RBC: 4.01 MIL/uL (ref 3.80–5.20)
RDW: 14.1 % (ref 11.5–14.5)
WBC: 6.8 10*3/uL (ref 3.6–11.0)

## 2018-03-19 LAB — BASIC METABOLIC PANEL
ANION GAP: 5 (ref 5–15)
BUN: 28 mg/dL — ABNORMAL HIGH (ref 8–23)
CO2: 28 mmol/L (ref 22–32)
CREATININE: 0.84 mg/dL (ref 0.44–1.00)
Calcium: 9.2 mg/dL (ref 8.9–10.3)
Chloride: 108 mmol/L (ref 98–111)
GFR calc Af Amer: >60 mL/min (ref >60)
Glucose, Bld: 159 mg/dL — ABNORMAL HIGH (ref 70–99)
Potassium: 4.4 mmol/L (ref 3.5–5.1)
SODIUM: 141 mmol/L (ref 135–145)

## 2018-03-19 LAB — GLUCOSE, CAPILLARY
GLUCOSE-CAPILLARY: 110 mg/dL — AB (ref 70–99)
Glucose-Capillary: 179 mg/dL — ABNORMAL HIGH (ref 70–99)
Glucose-Capillary: 127 mg/dL — ABNORMAL HIGH (ref 70–99)

## 2018-03-19 MED ORDER — SENNOSIDES-DOCUSATE SODIUM 8.6-50 MG PO TABS: 1.0000 | ORAL_TABLET | Freq: Every evening | ORAL | Status: DC | PRN

## 2018-03-19 MED ORDER — HYDROCODONE-ACETAMINOPHEN 5-325 MG PO TABS
1.0000 | ORAL_TABLET | ORAL | Status: DC | PRN
  Administered 2018-03-19 – 2018-03-20 (×3): 1 via ORAL
  Filled 2018-03-19: qty 1
  Filled 2018-03-19: qty 2
  Filled 2018-03-19 (×2): qty 1

## 2018-03-19 MED ORDER — VITAMIN B-12 1000 MCG PO TABS
1000.0000 ug | ORAL_TABLET | Freq: Every day | ORAL | Status: DC
  Administered 2018-03-20: 10:00:00 1000 ug via ORAL
  Filled 2018-03-19: qty 1

## 2018-03-19 MED ORDER — GABAPENTIN 600 MG PO TABS
600.0000 mg | ORAL_TABLET | Freq: Three times a day (TID) | ORAL | Status: DC
  Administered 2018-03-19 – 2018-03-20 (×3): 600 mg via ORAL
  Filled 2018-03-19 (×3): qty 1

## 2018-03-19 MED ORDER — MORPHINE SULFATE (PF) 2 MG/ML IV SOLN
2.0000 mg | INTRAVENOUS | Status: DC | PRN
  Administered 2018-03-19 – 2018-03-20 (×4): 2 mg via INTRAVENOUS
  Filled 2018-03-19 (×4): qty 1

## 2018-03-19 MED ORDER — TRAZODONE HCL 50 MG PO TABS: 50.0000 mg | ORAL_TABLET | Freq: Every evening | ORAL | Status: DC | PRN

## 2018-03-19 MED ORDER — MELATONIN 5 MG PO TABS
2.5000 mg | ORAL_TABLET | Freq: Every day | ORAL | Status: DC
  Filled 2018-03-19 (×2): qty 0.5

## 2018-03-19 MED ORDER — ZINC OXIDE 20 % EX OINT: 1.0000 "application " | TOPICAL_OINTMENT | CUTANEOUS | Status: DC | PRN

## 2018-03-19 MED ORDER — SIMVASTATIN 20 MG PO TABS
40.0000 mg | ORAL_TABLET | Freq: Every evening | ORAL | Status: DC
  Administered 2018-03-19: 19:00:00 40 mg via ORAL
  Filled 2018-03-19: qty 2

## 2018-03-19 MED ORDER — INSULIN ASPART 100 UNIT/ML ~~LOC~~ SOLN
0.0000 [IU] | Freq: Three times a day (TID) | SUBCUTANEOUS | Status: DC
  Administered 2018-03-19 – 2018-03-20 (×3): 2 [IU] via SUBCUTANEOUS
  Filled 2018-03-19 (×3): qty 1

## 2018-03-19 MED ORDER — ONDANSETRON HCL 4 MG/2ML IJ SOLN
4.0000 mg | Freq: Once | INTRAMUSCULAR | Status: AC
  Administered 2018-03-19: 4 mg via INTRAVENOUS
  Filled 2018-03-19: qty 2

## 2018-03-19 MED ORDER — ACETAMINOPHEN 650 MG RE SUPP: 650.0000 mg | Freq: Four times a day (QID) | RECTAL | Status: DC | PRN

## 2018-03-19 MED ORDER — SODIUM CHLORIDE 0.9% FLUSH
3.0000 mL | Freq: Two times a day (BID) | INTRAVENOUS | Status: DC
  Administered 2018-03-19 – 2018-03-20 (×3): 3 mL via INTRAVENOUS

## 2018-03-19 MED ORDER — INSULIN ASPART 100 UNIT/ML ~~LOC~~ SOLN: 0.0000 [IU] | Freq: Every day | SUBCUTANEOUS | Status: DC

## 2018-03-19 MED ORDER — PIOGLITAZONE HCL 30 MG PO TABS
30.0000 mg | ORAL_TABLET | Freq: Every day | ORAL | Status: DC
  Administered 2018-03-20: 12:00:00 30 mg via ORAL
  Filled 2018-03-19: qty 1

## 2018-03-19 MED ORDER — LEVOTHYROXINE SODIUM 50 MCG PO TABS
75.0000 ug | ORAL_TABLET | Freq: Every day | ORAL | Status: DC
  Administered 2018-03-20: 10:00:00 75 ug via ORAL
  Filled 2018-03-19: qty 1

## 2018-03-19 MED ORDER — SODIUM CHLORIDE 0.9% FLUSH: 3.0000 mL | INTRAVENOUS | Status: DC | PRN

## 2018-03-19 MED ORDER — ALENDRONATE SODIUM 70 MG PO TABS: 70.0000 mg | ORAL_TABLET | ORAL | Status: DC

## 2018-03-19 MED ORDER — ONDANSETRON HCL 4 MG PO TABS: 4.0000 mg | ORAL_TABLET | Freq: Four times a day (QID) | ORAL | Status: DC | PRN

## 2018-03-19 MED ORDER — MORPHINE SULFATE (PF) 2 MG/ML IV SOLN
2.0000 mg | Freq: Once | INTRAVENOUS | Status: DC
  Filled 2018-03-19: qty 1

## 2018-03-19 MED ORDER — ACETAMINOPHEN 325 MG PO TABS
650.0000 mg | ORAL_TABLET | Freq: Once | ORAL | Status: AC
  Administered 2018-03-19: 650 mg via ORAL
  Filled 2018-03-19: qty 2

## 2018-03-19 MED ORDER — POTASSIUM CHLORIDE CRYS ER 20 MEQ PO TBCR
40.0000 meq | EXTENDED_RELEASE_TABLET | Freq: Once | ORAL | Status: AC
  Administered 2018-03-20: 10:00:00 40 meq via ORAL
  Filled 2018-03-19: qty 2

## 2018-03-19 MED ORDER — SODIUM CHLORIDE 0.9 % IV SOLN
250.0000 mL | INTRAVENOUS | Status: DC | PRN
Start: 2018-03-19 — End: 2018-03-20

## 2018-03-19 MED ORDER — ACETAMINOPHEN 325 MG PO TABS: 650.0000 mg | ORAL_TABLET | Freq: Four times a day (QID) | ORAL | Status: DC | PRN

## 2018-03-19 MED ORDER — ENOXAPARIN SODIUM 30 MG/0.3ML ~~LOC~~ SOLN: 30.0000 mg | SUBCUTANEOUS | Status: DC

## 2018-03-19 MED ORDER — ONDANSETRON HCL 4 MG/2ML IJ SOLN: 4.0000 mg | Freq: Four times a day (QID) | INTRAMUSCULAR | Status: DC | PRN

## 2018-03-19 NOTE — Progress Notes (Signed)
Low bed with mats ordered for patient.

## 2018-03-19 NOTE — Progress Notes (Signed)
Advanced care plan. Purpose of the Encounter: CODE STATUS Parties in Attendance:Patient and care giver Patient's Decision Capacity:Good Subjective/Patient's story: Presented for fall Objective/Medical story Has pain in chest Imaging studies showed rib fractures Needs pain mgmt Goals of care determination:  Advance care directives and goals of care discussed Patient if full resuscitation which includes cpr and intubation if need arises CODE STATUS: Full code Time spent discussing advanced care planning: 16 minutes

## 2018-03-19 NOTE — Progress Notes (Signed)
Patient admitted to room 110 from the ED after having a mechanical fall today at her family care home. Patient alert to self, c/o 8/10 pain when she got here.Patient is in bed locked at lower position, bedside table, call light and telephone in patient's reach. Will continue to monitor patient, report received from [place], [place].

## 2018-03-19 NOTE — Plan of Care (Signed)
  Problem: Clinical Measurements: Goal: Will remain free from infection Outcome: Progressing Goal: Diagnostic test results will improve Outcome: Progressing Goal: Respiratory complications will improve Outcome: Progressing Goal: Cardiovascular complication will be avoided Outcome: Progressing   Problem: Nutrition: Goal: Adequate nutrition will be maintained Outcome: Progressing   Problem: Coping: Goal: Level of anxiety will decrease Outcome: Progressing   Problem: Pain Managment: Goal: General experience of comfort will improve Outcome: Progressing   Problem: Safety: Goal: Ability to remain free from injury will improve Outcome: Progressing   Problem: Skin Integrity: Goal: Risk for impaired skin integrity will decrease Outcome: Progressing   

## 2018-03-19 NOTE — ED Notes (Signed)
Reatha Armour, RN given report.

## 2018-03-19 NOTE — ED Triage Notes (Signed)
Pt to ED via POV with caretaker, pt had mechanical fall in bathroom this am. Denies any head injury or LOC. PT c/o back pain. PT hx of dementia, at baseline per caretaker. VSS

## 2018-03-19 NOTE — ED Notes (Signed)
Pt transported to CT at this time.

## 2018-03-19 NOTE — ED Notes (Signed)
Patient to room 10 via WC, assisted to stretcher, siderails up.

## 2018-03-19 NOTE — ED Notes (Signed)
Pt presents to ED from home with caregiver. Per caregiver pt was getting into shower and "lost her balance", also reports that she has a shower stool that she believes she hit getting into the shower that caused her to fall. Pt's caregiver reports witnessed fall at this time, pt did not hit her head or have any LOC. Caregiver reports that patient has dementia and is baseline at this time. Pt reports pain with palpation of posterior rib cage.

## 2018-03-19 NOTE — ED Notes (Signed)
Autumn Scott [phone] (pts caregiver)

## 2018-03-19 NOTE — H&P (Signed)
[place] [facility] [place] NAME: Autumn Scott    MR#:  [id number]  DATE OF BIRTH:  07-May-1932  DATE OF ADMISSION:  03/19/2018  PRIMARY CARE PHYSICIAN: Adin Hector, MD   REQUESTING/REFERRING PHYSICIAN:   CHIEF COMPLAINT:   Chief Complaint  Patient presents with  . Fall    HISTORY OF PRESENT ILLNESS: Autumn Scott  is a 84 y.o. female with a known history of anxiety disorder, hyperlipidemia, hypertension, hypothyroidism presented to the emergency room from family care home for a fall.  Patient was stepping out of the shower she tripped and fell and landed on her left chest.  Patient has pain in the left lower chest area which is aching in nature 6 out of 10 on a scale of 1-10.  No loss of consciousness, seizure activity imaging studies in the emergency room showed rib fractures on the left side.[facility] Hospitalist service was consulted for pain management patient was worked up with CT head and CT chest.. CT head no acute abnormality.  PAST MEDICAL HISTORY:   Past Medical History:  Diagnosis Date  . Anxiety   . Cancer [facility])    uterine cancer therefore had a hysterectomy  . High cholesterol   . Hypertension   . Hypothyroidism   . Neuropathy   . Thyroid disease     PAST SURGICAL HISTORY:  Past Surgical History:  Procedure Laterality Date  . ABDOMINAL HYSTERECTOMY    . APPENDECTOMY    . INTRAMEDULLARY (IM) NAIL INTERTROCHANTERIC Right 08/27/2016   Procedure: INTRAMEDULLARY (IM) NAIL INTERTROCHANTRIC;  Surgeon: Thornton Park, MD;  Location: [facility] ORS;  Service: Orthopedics;  Laterality: Right;    SOCIAL HISTORY:  Social History   Tobacco Use  . Smoking status: Former Smoker    Types: Cigarettes    Last attempt to quit: 08/19/1973    Years since quitting: 44.6  . Smokeless tobacco: Never Used  Substance Use Topics  . Alcohol use: No    FAMILY HISTORY: No family history on file.  DRUG ALLERGIES:  Allergies  Allergen  Reactions  . Sulfa Antibiotics Rash    REVIEW OF SYSTEMS:   CONSTITUTIONAL: No fever, fatigue or weakness.  EYES: No blurred or double vision.  EARS, NOSE, AND THROAT: No tinnitus or ear pain.  RESPIRATORY: No cough, shortness of breath, wheezing or hemoptysis.  CARDIOVASCULAR: No chest pain, orthopnea, edema.  GASTROINTESTINAL: No nausea, vomiting, diarrhea or abdominal pain.  GENITOURINARY: No dysuria, hematuria.  ENDOCRINE: No polyuria, nocturia,  HEMATOLOGY: No anemia, easy bruising or bleeding SKIN: No rash or lesion. MUSCULOSKELETAL: No joint pain or arthritis.   Left lower rib cage pain NEUROLOGIC: No tingling, numbness, weakness.  PSYCHIATRY: No anxiety or depression.   MEDICATIONS AT HOME:  Prior to Admission medications   Medication Sig Start Date End Date Taking? Authorizing Provider  alendronate (FOSAMAX) 70 MG tablet Take 70 mg by mouth once a week. Take with a full glass of water on an empty stomach.   Yes [provider]  gabapentin (NEURONTIN) 600 MG tablet Take 600 mg by mouth 3 (three) times daily.    Yes [provider]  levothyroxine (SYNTHROID, LEVOTHROID) 75 MCG tablet Take 75 mcg by mouth daily before breakfast.    Yes [provider]  loperamide (IMODIUM) 2 MG capsule Take 2 mg by mouth as needed for diarrhea or loose stools.   Yes [provider]  Melatonin 1 MG TABS Take 2 mg by mouth daily.  Yes [provider]  pioglitazone (ACTOS) 30 MG tablet Take 30 mg by mouth daily.   Yes [provider]  simvastatin (ZOCOR) 40 MG tablet Take 40 mg by mouth every evening.    Yes [provider]  traZODone (DESYREL) 50 MG tablet Take 50 mg by mouth at bedtime as needed for sleep.   Yes [provider]  vitamin B-12 (CYANOCOBALAMIN) 1000 MCG tablet Take 1,000 mcg by mouth daily.   Yes [provider]  zinc oxide 20 % ointment Apply 1 application topically as needed for irritation.   Yes  [provider]  feeding supplement, ENSURE ENLIVE, (ENSURE ENLIVE) LIQD Take 237 mLs by mouth 2 (two) times daily between meals. Patient not taking: Reported on 03/19/2018 08/29/16   Fritzi Mandes, MD  ferrous sulfate 325 (65 FE) MG tablet Take 1 tablet (325 mg total) by mouth 3 (three) times daily after meals. Patient not taking: Reported on 03/19/2018 08/29/16   Fritzi Mandes, MD  metoprolol tartrate (LOPRESSOR) 25 MG tablet Take 0.5 tablets (12.5 mg total) by mouth 2 (two) times daily. Patient not taking: Reported on 03/19/2018 08/29/16   Fritzi Mandes, MD      PHYSICAL EXAMINATION:   VITAL SIGNS: Blood pressure (!) 154/53, pulse (!) 44, temperature 97.6 F (36.4 C), temperature source Oral, resp. rate 12, weight 106 lb (48.1 kg), SpO2 99 %.  GENERAL:  84 y.o.-year-old patient lying in the bed with no acute distress.  EYES: Pupils equal, round, reactive to light and accommodation. No scleral icterus. Extraocular muscles intact.  HEENT: Head atraumatic, normocephalic. Oropharynx and nasopharynx clear.  NECK:  Supple, no jugular venous distention. No thyroid enlargement, no tenderness.  LUNGS: Normal breath sounds bilaterally, no wheezing, rales,rhonchi or crepitation. No use of accessory muscles of respiration.  CARDIOVASCULAR: S1, S2 normal. No murmurs, rubs, or gallops.  Left chest wall tenderness noted ABDOMEN: Soft, nontender, nondistended. Bowel sounds present. No organomegaly or mass.  EXTREMITIES: No pedal edema, cyanosis, or clubbing.  NEUROLOGIC: Cranial nerves II through XII are intact. Muscle strength 5/5 in all extremities. Sensation intact. Gait not checked.  PSYCHIATRIC: The patient is alert and oriented x 3.  SKIN: No obvious rash, lesion, or ulcer.   LABORATORY PANEL:   CBC Recent Labs  Lab 03/19/18 0948  WBC 6.8  HGB 12.0  HCT 35.5  PLT 201  MCV 88.5  MCH 29.9  MCHC 33.7  RDW 14.1  LYMPHSABS 0.5*  MONOABS 0.4  EOSABS 0.0  BASOSABS 0.0    ------------------------------------------------------------------------------------------------------------------  Chemistries  Recent Labs  Lab 03/19/18 0948  NA 141  K 4.4  CL 108  CO2 28  GLUCOSE 159*  BUN 28*  CREATININE 0.84  CALCIUM 9.2   ------------------------------------------------------------------------------------------------------------------ CrCl cannot be calculated (Unknown ideal weight.). ------------------------------------------------------------------------------------------------------------------ No results for input(s): TSH, T4TOTAL, T3FREE, THYROIDAB in the last 72 hours.  Invalid input(s): FREET3   Coagulation profile No results for input(s): INR, PROTIME in the last 168 hours. ------------------------------------------------------------------------------------------------------------------- No results for input(s): DDIMER in the last 72 hours. -------------------------------------------------------------------------------------------------------------------  Cardiac Enzymes No results for input(s): CKMB, TROPONINI, MYOGLOBIN in the last 168 hours.  Invalid input(s): CK ------------------------------------------------------------------------------------------------------------------ Invalid input(s): POCBNP  ---------------------------------------------------------------------------------------------------------------  Urinalysis    Component Value Date/Time   COLORURINE AMBER (A) 08/27/2016 1032   APPEARANCEUR CLOUDY (A) 08/27/2016 1032   LABSPEC 1.027 08/27/2016 1032   PHURINE 5.0 08/27/2016 1032   GLUCOSEU 50 (A) 08/27/2016 1032   HGBUR SMALL (A) 08/27/2016 1032   BILIRUBINUR NEGATIVE 08/27/2016  [place] 08/27/2016 1032   PROTEINUR 30 (A) 08/27/2016 1032   NITRITE NEGATIVE 08/27/2016 1032   LEUKOCYTESUR LARGE (A) 08/27/2016 1032     RADIOLOGY: Ct Chest Wo Contrast  Result Date: 03/19/2018 CLINICAL DATA:   Fall, left chest pain EXAM: CT CHEST WITHOUT CONTRAST TECHNIQUE: Multidetector CT imaging of the chest was performed following the standard protocol without IV contrast. COMPARISON:  Chest two-view 07/06/2015 FINDINGS: Cardiovascular: Atherosclerotic calcification in the aorta without aneurysm. Coronary artery calcification. Heart size normal. No pericardial effusion. Mediastinum/Nodes: Subcarinal lymph node 14 x 21 mm. No other adenopathy or mass in the mediastinum. Negative for hematoma. Normal esophagus. Lungs/Pleura: Lungs are clear without infiltrate effusion or pneumothorax. Negative for lung mass. Upper Abdomen: Atherosclerotic aorta without aneurysm. No acute abnormality in the upper abdomen. Benign calcification in the spleen. Musculoskeletal: Multiple left rib fractures. Fractures of the left 6, 7, 8, 9, and tenth ribs posterolaterally. The ninth rib fracture is displaced. No pneumothorax or effusion. IMPRESSION: Fractures left sixth, seventh, eighth, ninth, and tenth ribs posterolaterally. Negative for effusion or pneumothorax. The lungs are clear Subcarinal lymph node 14 x 21 mm.  No other enlarged nodes or mass. Aortic Atherosclerosis (ICD10-I70.0).  Coronary artery calcification Electronically Signed   By: Franchot Gallo M.D.   On: 03/19/2018 09:00    EKG: Orders placed or performed during the hospital encounter of 08/26/16  . EKG 12-Lead  . EKG 12-Lead  . EKG    IMPRESSION AND PLAN:  84 year old elderly female patient with history of hypertension, hyperlipidemia, hypothyroidism who is a resident of family care home presented to the management for fall  -Left sixth seventh eighth ninth and 10th rib fractures Pain management with oral Norco and PRN morphine IV along with Tylenol Monitor for any pulmonary complications  -Hypertension Resume metoprolol orally  -Hyperlipidemia Resume statin medication at home dose  -DVT prophylaxis subcu Lovenox daily  All the records are reviewed  and case discussed with ED provider. Management plans discussed with the patient, family and they are in agreement.  CODE STATUS:Full code Code Status History    Date Active Date Inactive Code Status Order ID Comments User Context   08/26/2016 2330 08/29/2016 1748 Full Code [id number]  Harvie Bridge, DO Inpatient   08/26/2016 2155 08/26/2016 2155 Full Code [id number]  Thornton Park, MD ED       TOTAL TIME TAKING CARE OF THIS PATIENT: 53 minutes.    Saundra Shelling M.D on 03/19/2018 at 11:39 AM  Between 7am to 6pm - Pager - [phone]  After 6pm go to [email or web address] - password [email or web address] [facility]  [place] [facility]  Office  [phone]  CC: Primary care physician; Adin Hector, MD

## 2018-03-19 NOTE — ED Provider Notes (Signed)
[facility] Emergency Department Provider Note ____________________________________________   First MD Initiated Contact with Patient 03/19/18 0740     (approximate)  I have reviewed the triage vital signs and the nursing notes.   HISTORY  Chief Complaint Fall  HPI Autumn Scott is a 84 y.o. female with a history of dementia, not on any blood thinners, who is presenting to the emergency department today with left sided flank pain after a fall.  She is accompanied by her caretaker who states that the patient was stepping into the bathtub and lost balance and then fell, hitting her left flank on a stool.  There was no head trauma or loss of consciousness.  Patient denying any neck pain or belly pain.  No hip pain.  Patient does not know the exact details of the fall secondary to her dementia.  However, the fall was witnessed.  Past Medical History:  Diagnosis Date  . Anxiety   . Cancer [facility])    uterine cancer therefore had a hysterectomy  . High cholesterol   . Hypertension   . Hypothyroidism   . Neuropathy   . Thyroid disease     Patient Active Problem List   Diagnosis Date Noted  . Closed intertrochanteric fracture of hip, right, initial encounter ([place]) 08/26/2016    Past Surgical History:  Procedure Laterality Date  . ABDOMINAL HYSTERECTOMY    . APPENDECTOMY    . INTRAMEDULLARY (IM) NAIL INTERTROCHANTERIC Right 08/27/2016   Procedure: INTRAMEDULLARY (IM) NAIL INTERTROCHANTRIC;  Surgeon: Thornton Park, MD;  Location: [facility] ORS;  Service: Orthopedics;  Laterality: Right;    Prior to Admission medications   Medication Sig Start Date End Date Taking? Authorizing Provider  cephALEXin (KEFLEX) 500 MG capsule Take 1 capsule (500 mg total) by mouth every 12 (twelve) hours. 08/29/16   Fritzi Mandes, MD  enoxaparin (LOVENOX) 30 MG/0.3ML injection Inject 0.3 mLs (30 mg total) into the skin daily. 08/29/16   Fritzi Mandes, MD  feeding supplement, ENSURE  ENLIVE, (ENSURE ENLIVE) LIQD Take 237 mLs by mouth 2 (two) times daily between meals. 08/29/16   Fritzi Mandes, MD  ferrous sulfate 325 (65 FE) MG tablet Take 1 tablet (325 mg total) by mouth 3 (three) times daily after meals. 08/29/16   Fritzi Mandes, MD  gabapentin (NEURONTIN) 600 MG tablet Take 600 mg by mouth every 6 (six) hours.    [provider]  levothyroxine (SYNTHROID, LEVOTHROID) 100 MCG tablet Take 100 mcg by mouth daily before breakfast.    [provider]  lisinopril (PRINIVIL,ZESTRIL) 10 MG tablet Take 10 mg by mouth daily.    [provider]  metoprolol tartrate (LOPRESSOR) 25 MG tablet Take 0.5 tablets (12.5 mg total) by mouth 2 (two) times daily. 08/29/16   Fritzi Mandes, MD  oxyCODONE (OXY IR/ROXICODONE) 5 MG immediate release tablet Take 1-2 tablets (5-10 mg total) by mouth every 4 (four) hours as needed for breakthrough pain ((for MODERATE breakthrough pain)). 08/29/16   Fritzi Mandes, MD  simvastatin (ZOCOR) 40 MG tablet Take 40 mg by mouth daily at 6 PM.    [provider]    Allergies Sulfa antibiotics  No family history on file.  Social History Social History   Tobacco Use  . Smoking status: Former Smoker    Types: Cigarettes    Last attempt to quit: 08/19/1973    Years since quitting: 44.6  . Smokeless tobacco: Never Used  Substance Use Topics  . Alcohol use: No  . Drug  use: No    Review of Systems  Constitutional: No fever/chills Eyes: No visual changes. ENT: No sore throat. Cardiovascular: Left lower chest and flank pain over site of the impact. Respiratory: Denies shortness of breath. Gastrointestinal: No abdominal pain.  No nausea, no vomiting.  No diarrhea.  No constipation. Genitourinary: Negative for dysuria. Musculoskeletal: Negative for back pain. Skin: Negative for rash. Neurological: Negative for headaches, focal weakness or numbness.   ____________________________________________   PHYSICAL EXAM:  VITAL  SIGNS: ED Triage Vitals  Enc Vitals Group     BP 03/19/18 0735 (!) 140/50     Pulse Rate 03/19/18 0733 (!) 49     Resp 03/19/18 0733 16     Temp 03/19/18 0733 97.6 F (36.4 C)     Temp Source 03/19/18 0733 Oral     SpO2 03/19/18 0733 100 %     Weight 03/19/18 0733 106 lb (48.1 kg)     Height --      Head Circumference --      Peak Flow --      Pain Score --      Pain Loc --      Pain Edu? --      Excl. in [place]? --     Constitutional: Alert and oriented. Well appearing and in no acute distress. Eyes: Conjunctivae are normal.  Head: Atraumatic. Nose: No congestion/rhinnorhea. Mouth/Throat: Mucous membranes are moist.  Neck: No stridor.  No tenderness to the cervical spine.  No deformity or step-off. Cardiovascular: Normal rate, regular rhythm. Grossly normal heart sounds.   Respiratory: Normal respiratory effort.  No retractions. Lungs CTAB. Gastrointestinal: Soft and nontender. No distention. No CVA tenderness.  Specifically, there is no tenderness to the left upper quadrant over the splenic region. Musculoskeletal: No lower extremity tenderness nor edema.  No joint effusions.  5 out of 5 strength bilateral lower extremities with active range of motion without issue to the bilateral hips.  No tenderness to the bilateral hips.  No ecchymosis or crepitus.  However, there is tenderness to palpation over the left lateral and lower ribs that is moderate to severe.  No tenderness to midline thoracic nor lumbar spines.  Neurologic:  Normal speech and language. No gross focal neurologic deficits are appreciated. Skin:  Skin is warm, dry and intact. No rash noted. Psychiatric: Mood and affect are normal. Speech and behavior are normal.  ____________________________________________   LABS (all labs ordered are listed, but only abnormal results are displayed)  Labs Reviewed - No data to  display ____________________________________________  EKG   ____________________________________________  RADIOLOGY  Rib fractures of the 6 7 the eighth and ninth and 10th ribs posterior laterally.  Displaced ninth rib fracture. ____________________________________________   PROCEDURES  Procedure(s) performed:   Procedures  Critical Care performed:   ____________________________________________   INITIAL IMPRESSION / ASSESSMENT AND PLAN / ED COURSE  Pertinent labs & imaging results that were available during my care of the patient were reviewed by me and considered in my medical decision making (see chart for details).  Differential diagnosis includes, but is not limited to, biliary disease (biliary colic, acute cholecystitis, cholangitis, choledocholithiasis, etc), intrathoracic causes for epigastric abdominal pain including ACS, gastritis, duodenitis, pancreatitis, small bowel or large bowel obstruction, abdominal aortic aneurysm, hernia, and ulcer(s). As part of my medical decision making, I reviewed the following data within the electronic MEDICAL RECORD NUMBER Notes from prior outpatient visits  ----------------------------------------- 9:28 AM on 03/19/2018 -----------------------------------------  Patient at this time with pain  not relieved by Tylenol.  5 rib fractures in this elderly patient with dementia.  I believe this will be a high risk for development of pneumonia or possibly underlying pulmonary contusion.  I believe patient will require admission to the hospital.  To be signed out to the hospitalist.  Patient and caretaker aware. ____________________________________________   FINAL CLINICAL IMPRESSION(S) / ED DIAGNOSES  Multiple rib fractures.  NEW MEDICATIONS STARTED DURING THIS VISIT:  New Prescriptions   No medications on file     Note:  This document was prepared using Dragon voice recognition software and may include unintentional dictation  errors.     Orbie Pyo, MD 03/19/18 [phone]

## 2018-03-20 LAB — BASIC METABOLIC PANEL
ANION GAP: 6 (ref 5–15)
BUN: 38 mg/dL — AB (ref 8–23)
CHLORIDE: 106 mmol/L (ref 98–111)
CO2: 30 mmol/L (ref 22–32)
Calcium: 9.2 mg/dL (ref 8.9–10.3)
Creatinine, Ser: 1.03 mg/dL — ABNORMAL HIGH (ref 0.44–1.00)
GFR calc Af Amer: 56 mL/min — ABNORMAL LOW (ref >60)
GFR, EST NON AFRICAN AMERICAN: 49 mL/min — AB (ref >60)
GLUCOSE: 197 mg/dL — AB (ref 70–99)
POTASSIUM: 4.4 mmol/L (ref 3.5–5.1)
Sodium: 142 mmol/L (ref 135–145)

## 2018-03-20 LAB — CBC
HCT: 32.5 % — ABNORMAL LOW (ref 35.0–47.0)
HEMOGLOBIN: 11.1 g/dL — AB (ref 12.0–16.0)
MCH: 30.1 pg (ref 26.0–34.0)
MCHC: 34 g/dL (ref 32.0–36.0)
MCV: 88.3 fL (ref 80.0–100.0)
Platelets: 185 10*3/uL (ref 150–440)
RBC: 3.68 MIL/uL — ABNORMAL LOW (ref 3.80–5.20)
RDW: 14.1 % (ref 11.5–14.5)
WBC: 5.8 10*3/uL (ref 3.6–11.0)

## 2018-03-20 LAB — GLUCOSE, CAPILLARY
GLUCOSE-CAPILLARY: 167 mg/dL — AB (ref 70–99)
Glucose-Capillary: 185 mg/dL — ABNORMAL HIGH (ref 70–99)

## 2018-03-20 MED ORDER — HYDROCODONE-ACETAMINOPHEN 5-325 MG PO TABS: 1.0000 | ORAL_TABLET | ORAL | 0 refills | Status: AC | PRN

## 2018-03-20 NOTE — Care Management (Signed)
Physical therapy is recommending home with home heath and therapy in the home. Spoke with Ms Javier Glazier. States she has no preference which agency comes the her home. Requesting orders from Dr. Estanislado Pandy.  Referral to [facility]. Ms. Javier Glazier will transport Shelbie Ammons RN MSN [facility] [phone]

## 2018-03-20 NOTE — Clinical Social Work Note (Signed)
Patient is medically ready for discharge back to family care home. CSW notified Clare Gandy, director of group home [phone] of discharge today. Zigmund Daniel will pick up patient and transport her back to facility.   [place], [place]

## 2018-03-20 NOTE — Plan of Care (Signed)

## 2018-03-20 NOTE — Progress Notes (Signed)
Patient discharged with caregiver, Clare Gandy, education given to caregiver, verbalized understanding. Patient with no complaints.

## 2018-03-20 NOTE — Care Management Obs Status (Signed)
[facility] NOTIFICATION   Patient Details  Name: Autumn Scott MRN: [id number] Date of Birth: [birth year]   Medicare Observation Status Notification Given:  Yes  Permission to x    Shelbie Ammons, RN 03/20/2018, 8:28 AM

## 2018-03-20 NOTE — Evaluation (Signed)
Physical Therapy Evaluation Patient Details Name: Autumn Scott MRN: [id number] DOB: October 19, 1931 Today's Date: 03/20/2018   History of Present Illness  Pt is a 84 y/o F who presented s/p fall with resultant L rib fx's posterolaterally 6-10.  Negative for effusion or pneumothorax. CT head negative.  Pt's PMH incudes dementia, uterine CA, neuropathy, R hip IM nail.    Clinical Impression  Pt is a pleasant 84 year old female who was admitted for L rib 6-10 fractures. Pt performs bed mobility, transfers, and ambulation with CGA to min assist. Pt demonstrates deficits with strength, balance and pain. Pt has baseline dementia and is only oriented to person. Pt states that she is in 9/10 pain at beginning of session. Pts strength of LEs equal bilateral and WNL. Pts HR monitored throughout session and ranged from the high 50s to low 70s. Pt educated about pain relief techniques and ways to avoid increasing pain. Pt min assist sidelying to sit and sit to stand however amb 200' without AD and intermittent UE support on handrail or furniture. Pt does not appear fatigued at conclusion of session. Pt could benefit from continued skilled therapy at this time to improve deficits toward PLOF. PT will continue to work with pt at least 2x/week while admitted. D/c recommendations at this time are returning to ALF with home health PT. This entire session was guided, instructed, and directly supervised by Collie Siad, DPT.       Follow Up Recommendations Other (comment);Home health PT(Return to family care home with home health PT)    Equipment Recommendations  None recommended by PT    Recommendations for Other Services       Precautions / Restrictions Precautions Precautions: Fall;Other (comment) Precaution Comments: L rib fx's Restrictions Weight Bearing Restrictions: No      Mobility  Bed Mobility Overal bed mobility: Needs Assistance Bed Mobility: Rolling;Supine to Sit Rolling: Min guard    Supine to sit: Min assist     General bed mobility comments: pt CGA to roll onto R side, however requires min assist to go sidelying to sitting. Pt likely impaired due to pain  Transfers Overall transfer level: Needs assistance Equipment used: None Transfers: Sit to/from Stand Sit to Stand: Min assist         General transfer comment: pt min assist to transfer sit<>stand from low surface. Pt able to maintain upright posture with CGA.  Ambulation/Gait Ambulation/Gait assistance: Min guard(intermittent use of hand rail or furniture) Gait Distance (Feet): 200 Feet Assistive device: None(intermittent use of handrail or furniture) Gait Pattern/deviations: Step-through pattern;Decreased step length - right;Decreased step length - left     General Gait Details: pt CGA and intermittent use of hand rail or furniture. Pt appears unsteady during gait however states that she feels like she is moving at her baseline  Financial trader Rankin (Stroke Patients Only)       Balance Overall balance assessment: Needs assistance   Sitting balance-Leahy Scale: Good Sitting balance - Comments: Pt able to sit EOB without UE support and no LOB     Standing balance-Leahy Scale: Good Standing balance comment: Pt able to stand without UE support for short periods of time.                             Pertinent Vitals/Pain Pain Assessment: 0-10 Pain Score: 9  Pain  Location: L flank Pain Intervention(s): Limited activity within patient's tolerance;Monitored during session    [place] expects to be discharged to:: Other (Comment)                 Additional Comments: Family care home ([facility])    Prior Function Level of Independence: Needs assistance   Gait / Transfers Assistance Needed: Pt ambulated without AD.  Walking ~1 mile/day.  Was sweeping.  No additional recent falls.       Comments:  Information on PLOF provided partially by caregiver. Pt has 24/7 assist/supervision available from caregivers.      Hand Dominance        Extremity/Trunk Assessment   Upper Extremity Assessment Upper Extremity Assessment: RUE deficits/detail;LUE deficits/detail RUE Deficits / Details: Grossly at least 4/5 including grip strength, elbow flex/ext, shld flex LUE Deficits / Details: Deferred due to pain, able to perform all AROM of elbow and shld    Lower Extremity Assessment Lower Extremity Assessment: Overall WFL for tasks assessed(Grossly at least 4/5 including hip flex, knee flex/ext)    Cervical / Trunk Assessment Cervical / Trunk Assessment: Other exceptions Cervical / Trunk Exceptions: fx of ribs 6-10  Communication   Communication: No difficulties  Cognition Arousal/Alertness: Awake/alert Behavior During Therapy: WFL for tasks assessed/performed Overall Cognitive Status: History of cognitive impairments - at baseline                                 General Comments: Pt not oriented to location, time, situation.       General Comments General comments (skin integrity, edema, etc.): Caregiver present during evaluation.     Exercises     Assessment/Plan    PT Assessment Patient needs continued PT services  PT Problem List Decreased strength;Decreased range of motion;Decreased activity tolerance;Decreased balance;Decreased mobility;Decreased cognition;Decreased knowledge of use of DME;Pain       PT Treatment Interventions DME instruction;Stair training;Gait training;Functional mobility training;Therapeutic activities;Therapeutic exercise;Balance training;Neuromuscular re-education;Patient/family education    PT Goals (Current goals can be found in the Care Plan section)  Acute Rehab PT Goals Patient Stated Goal: "to be able to do what I want to without risk of getting hurt" PT Goal Formulation: With patient Time For Goal Achievement:  04/03/18 Potential to Achieve Goals: Good    Frequency Min 2X/week   Barriers to discharge        Co-evaluation               AM-PAC PT "6 Clicks" Daily Activity  Outcome Measure Difficulty turning over in bed (including adjusting bedclothes, sheets and blankets)?: A Little Difficulty moving from lying on back to sitting on the side of the bed? : Unable Difficulty sitting down on and standing up from a chair with arms (e.g., wheelchair, bedside commode, etc,.)?: Unable Help needed moving to and from a bed to chair (including a wheelchair)?: A Little Help needed walking in hospital room?: A Little Help needed climbing 3-5 steps with a railing? : A Lot 6 Click Score: 13    End of Session Equipment Utilized During Treatment: Gait belt Activity Tolerance: Patient tolerated treatment well Patient left: in chair Nurse Communication: Mobility status PT Visit Diagnosis: Unsteadiness on feet (R26.81);Other abnormalities of gait and mobility (R26.89);Muscle weakness (generalized) (M62.81);History of falling (Z91.81);Difficulty in walking, not elsewhere classified (R26.2);Pain Pain - Right/Left: Left Pain - part of body: (chest)    Time: 6045-4098 PT Time  Calculation (min) (ACUTE ONLY): 28 min   Charges:   PT Evaluation $PT Eval Low Complexity: 1 Low PT Treatments $Gait Training: 8-22 mins       [employer], SPT  Jenevieve Kirschbaum 03/20/2018, 1:08 PM

## 2018-03-20 NOTE — Clinical Social Work Note (Signed)
Clinical Social Work Assessment  Patient Details  Name: Autumn Scott MRN: [id number] Date of Birth: Mar 01, 1932  Date of referral:  03/20/18               Reason for consult:  Facility Placement                Permission sought to share information with:  Case Manager, Customer service manager, Family Supports Permission granted to share information::  Yes, Verbal Permission Granted  Name::        Agency::     Relationship::     Contact Information:     Housing/Transportation Living arrangements for the past 2 months:  Group Home Source of Information:  Adult Children Patient Interpreter Needed:  None Criminal Activity/Legal Involvement Pertinent to Current Situation/Hospitalization:  No - Comment as needed Significant Relationships:  Adult Children, Friend Lives with:  Facility Resident Do you feel safe going back to the place where you live?  Yes Need for family participation in patient care:  Yes (Comment)  Care giving concerns:  Patient lives at [place] Worker assessment / plan:  CSW consulted for facility placement. CSW spoke with patient's son Karrie Fluellen [phone]. CSW introduced self and explained role. Son states that patient has lived at [facility] for about a year. Son states that he would like for patient to return to group home because that is her home. CSW also spoke with Clare Gandy [phone], group home director. Zigmund Daniel states that patient was walking independently before fall yesterday and would usually walk about a mile each day. Zigmund Daniel also states that patient was able to sweep and do different chores around the house before fall. Zigmund Daniel would like patient to return to group home when able. CSW will follow for discharge planning.   Employment status:  Retired Nurse, adult PT Recommendations:  Not assessed at this time Information / Referral to community resources:     Patient/Family's  Response to care:  Patient is confused and unable to participate in assessment   Patient/Family's Understanding of and Emotional Response to Diagnosis, Current Treatment, and Prognosis:  Family in agreement with discharge back to group home when ready.   Emotional Assessment Appearance:  Appears stated age Attitude/Demeanor/Rapport:  Avoidant Affect (typically observed):  Restless Orientation:  Oriented to Self Alcohol / Substance use:  Not Applicable Psych involvement (Current and /or in the community):  No (Comment)  Discharge Needs  Concerns to be addressed:  Discharge Planning Concerns Readmission within the last 30 days:  No Current discharge risk:  None Barriers to Discharge:  Continued Medical Work up   [employer], [place] 03/20/2018, 9:51 AM

## 2018-03-20 NOTE — NC FL2 (Signed)
[place] LEVEL OF CARE SCREENING TOOL     IDENTIFICATION  Patient Name: Autumn Scott Birthdate: 10/08/1931 Sex: female Admission Date (Current Location): 03/19/2018  [place] and [place] Number:  Engineering geologist and Address:  [facility], [address] [place], [place] [address]      Provider Number: [id number]  Attending Physician Name and Address:  Saundra Shelling, MD  Relative Name and Phone Number:  Rozalia Dino- son [phone]    Current Level of Care: Hospital Recommended Level of Care: [facility] Prior Approval Number:    Date Approved/Denied:   PASRR Number:    Discharge Plan: Other (Comment)(Family Care Home )    Current Diagnoses: Patient Active Problem List   Diagnosis Date Noted  . Multiple rib fractures 03/19/2018  . Closed intertrochanteric fracture of hip, right, initial encounter ([place]) 08/26/2016    Orientation RESPIRATION BLADDER Height & Weight     Self  Normal Continent Weight: 106 lb (48.1 kg) Height:     BEHAVIORAL SYMPTOMS/MOOD NEUROLOGICAL BOWEL NUTRITION STATUS  (none) (none) Continent Diet(Heart Healthy )  AMBULATORY STATUS COMMUNICATION OF NEEDS Skin   Limited Assist Verbally Normal                       Personal Care Assistance Level of Assistance  Bathing, Feeding, Dressing Bathing Assistance: Limited assistance Feeding assistance: Independent Dressing Assistance: Independent     Functional Limitations Info  Sight, Hearing, Speech Sight Info: Adequate Hearing Info: Adequate Speech Info: Adequate    SPECIAL CARE FACTORS FREQUENCY                       Contractures Contractures Info: Not present    Additional Factors Info  Code Status, Allergies Code Status Info: Full Code  Allergies Info: Sulfa Antibiotics           Current Medications (03/20/2018):  This is the current hospital active medication list Current Facility-Administered Medications   Medication Dose Route Frequency Provider Last Rate Last Dose  . 0.9 %  sodium chloride infusion  250 mL Intravenous PRN Pyreddy, Reatha Harps, MD      . acetaminophen (TYLENOL) tablet 650 mg  650 mg Oral Q6H PRN Pyreddy, Reatha Harps, MD       Or  . acetaminophen (TYLENOL) suppository 650 mg  650 mg Rectal Q6H PRN Pyreddy, Reatha Harps, MD      . enoxaparin (LOVENOX) injection 30 mg  30 mg Subcutaneous Q24H Pyreddy, Pavan, MD      . gabapentin (NEURONTIN) tablet 600 mg  600 mg Oral TID Saundra Shelling, MD   600 mg at 03/19/18 2126  . HYDROcodone-acetaminophen (NORCO/VICODIN) 5-325 MG per tablet 1-2 tablet  1-2 tablet Oral Q4H PRN Saundra Shelling, MD   1 tablet at 03/20/18 0656  . insulin aspart (novoLOG) injection 0-5 Units  0-5 Units Subcutaneous QHS Pyreddy, Pavan, MD      . insulin aspart (novoLOG) injection 0-9 Units  0-9 Units Subcutaneous TID WC Saundra Shelling, MD   2 Units at 03/20/18 0757  . levothyroxine (SYNTHROID, LEVOTHROID) tablet 75 mcg  75 mcg Oral QAC breakfast Pyreddy, Pavan, MD      . Melatonin TABS 2.5 mg  2.5 mg Oral Daily Pyreddy, Pavan, MD      . morphine 2 MG/ML injection 2 mg  2 mg Intravenous Once Orbie Pyo, MD   Stopped at 03/19/18 [phone]  . morphine 2 MG/ML injection 2 mg  2 mg Intravenous Q4H PRN Saundra Shelling, MD   2 mg at 03/20/18 0416  . ondansetron (ZOFRAN) tablet 4 mg  4 mg Oral Q6H PRN Pyreddy, Reatha Harps, MD       Or  . ondansetron (ZOFRAN) injection 4 mg  4 mg Intravenous Q6H PRN Pyreddy, Pavan, MD      . pioglitazone (ACTOS) tablet 30 mg  30 mg Oral Q breakfast Pyreddy, Pavan, MD      . potassium chloride SA (K-DUR,KLOR-CON) CR tablet 40 mEq  40 mEq Oral Once Pyreddy, Reatha Harps, MD      . senna-docusate (Senokot-S) tablet 1 tablet  1 tablet Oral QHS PRN Saundra Shelling, MD      . simvastatin (ZOCOR) tablet 40 mg  40 mg Oral QPM Pyreddy, Reatha Harps, MD   40 mg at 03/19/18 1858  . sodium chloride flush (NS) 0.9 % injection 3 mL  3 mL Intravenous Q12H Saundra Shelling, MD   3 mL at  03/19/18 2138  . sodium chloride flush (NS) 0.9 % injection 3 mL  3 mL Intravenous PRN Pyreddy, Pavan, MD      . traZODone (DESYREL) tablet 50 mg  50 mg Oral QHS PRN Pyreddy, Pavan, MD      . vitamin B-12 (CYANOCOBALAMIN) tablet 1,000 mcg  1,000 mcg Oral Daily Pyreddy, Reatha Harps, MD         Discharge Medications: Please see discharge summary for a list of discharge medications.  Relevant Imaging Results:  Relevant Lab Results:   Additional Information    Wrenly Lauritsen  Louretta Shorten, LCSWA

## 2018-03-20 NOTE — Discharge Summary (Addendum)
[place] [facility] [place] NAME: Autumn Scott    MR#:  [id number]  DATE OF BIRTH:  March 31, 1932  DATE OF ADMISSION:  03/19/2018 ADMITTING PHYSICIAN: Saundra Shelling, MD  DATE OF DISCHARGE: 8/2//2019  PRIMARY CARE PHYSICIAN: Tama High III, MD   ADMISSION DIAGNOSIS:  Closed fracture of multiple ribs of left side, initial encounter [S22.42XA] Chest wall pain DISCHARGE DIAGNOSIS:  Active Problems:   Multiple rib fractures Musculoskeletal chest pain  Hypertension Hypothyroidism   SECONDARY DIAGNOSIS:   Past Medical History:  Diagnosis Date  . Anxiety   . Cancer [facility])    uterine cancer therefore had a hysterectomy  . Diabetes mellitus ([place])   . High cholesterol   . Hypertension   . Hypothyroidism   . Neuropathy   . Thyroid disease      ADMITTING HISTORY Autumn Scott  is a 84 y.o. female with a known history of anxiety disorder, hyperlipidemia, hypertension, hypothyroidism presented to the emergency room from family care home for a fall.  Patient was stepping out of the shower she tripped and fell and landed on her left chest.  Patient has pain in the left lower chest area which is aching in nature 6 out of 10 on a scale of 1-10.  No loss of consciousness, seizure activity imaging studies in the emergency room showed rib fractures on the left side.Marland Kitchen Hospitalist service was consulted for pain management patient was worked up with CT head and CT chest.. CT head no acute abnormality.  HOSPITAL COURSE:  Patient was admitted to medical floor under observation bed.  Pain management was done with oral Norco and PRN IV morphine.  Patient was worked up with CT head which showed no acute abnormality and CT chest which showed multiple rib fractures on the left side.  No shortness of breath.  No pneumothorax or pulmonary hemorrhage on the CAT scan.  Tolerated pain well and received physical therapy.  Patient hemodynamically stable will be discharged  back to family care group home.  Disposition plan was discussed with patient and patient's son who were in agreement with the plan.  CONSULTS OBTAINED:  None  DRUG ALLERGIES:   Allergies  Allergen Reactions  . Sulfa Antibiotics Rash    DISCHARGE MEDICATIONS:   Allergies as of 03/20/2018      Reactions   Sulfa Antibiotics Rash      Medication List    STOP taking these medications   feeding supplement (ENSURE ENLIVE) Liqd   ferrous sulfate 325 (65 FE) MG tablet   metoprolol tartrate 25 MG tablet Commonly known as:  LOPRESSOR     TAKE these medications   alendronate 70 MG tablet Commonly known as:  FOSAMAX Take 70 mg by mouth once a week. Take with a full glass of water on an empty stomach.   gabapentin 600 MG tablet Commonly known as:  NEURONTIN Take 600 mg by mouth 3 (three) times daily.   HYDROcodone-acetaminophen 5-325 MG tablet Commonly known as:  NORCO/VICODIN Take 1-2 tablets by mouth every 4 (four) hours as needed for up to 5 days for moderate pain.   levothyroxine 75 MCG tablet Commonly known as:  SYNTHROID, LEVOTHROID Take 75 mcg by mouth daily before breakfast.   loperamide 2 MG capsule Commonly known as:  IMODIUM Take 2 mg by mouth as needed for diarrhea or loose stools.   Melatonin 1 MG Tabs Take 2 mg by mouth daily.   pioglitazone 30 MG tablet Commonly known  as:  ACTOS Take 30 mg by mouth daily.   simvastatin 40 MG tablet Commonly known as:  ZOCOR Take 40 mg by mouth every evening.   traZODone 50 MG tablet Commonly known as:  DESYREL Take 50 mg by mouth at bedtime as needed for sleep.   vitamin B-12 1000 MCG tablet Commonly known as:  CYANOCOBALAMIN Take 1,000 mcg by mouth daily.   zinc oxide 20 % ointment Apply 1 application topically as needed for irritation.       Today  Patient seen today Decrease chest wall pain No difficulty breathing Tolerating diet well VITAL SIGNS:  Blood pressure (!) 139/53, pulse (!) 57, temperature  98.5 F (36.9 C), temperature source Oral, resp. rate 16, weight 106 lb (48.1 kg), SpO2 100 %.  I/O:    Intake/Output Summary (Last 24 hours) at 03/20/2018 1226 Last data filed at 03/20/2018 1042 Gross per 24 hour  Intake 723 ml  Output -  Net 723 ml    PHYSICAL EXAMINATION:  Physical Exam  GENERAL:  84 y.o.-year-old patient lying in the bed with no acute distress.  LUNGS: Normal breath sounds bilaterally, no wheezing, rales,rhonchi or crepitation. No use of accessory muscles of respiration.  CARDIOVASCULAR: S1, S2 normal. No murmurs, rubs, or gallops.  ABDOMEN: Soft, non-tender, non-distended. Bowel sounds present. No organomegaly or mass.  NEUROLOGIC: Moves all 4 extremities. PSYCHIATRIC: The patient is alert and oriented x 3.  SKIN: No obvious rash, lesion, or ulcer.   DATA REVIEW:   CBC Recent Labs  Lab 03/20/18 0500  WBC 5.8  HGB 11.1*  HCT 32.5*  PLT 185    Chemistries  Recent Labs  Lab 03/20/18 0500  NA 142  K 4.4  CL 106  CO2 30  GLUCOSE 197*  BUN 38*  CREATININE 1.03*  CALCIUM 9.2    Cardiac Enzymes No results for input(s): TROPONINI in the last 168 hours.  Microbiology Results  Results for orders placed or performed during the hospital encounter of 08/26/16  Surgical PCR screen     Status: None   Collection Time: 08/26/16 11:28 PM  Result Value Ref Range Status   MRSA, PCR NEGATIVE NEGATIVE Final   Staphylococcus aureus NEGATIVE NEGATIVE Final    Comment:        The Xpert SA Assay (FDA approved for NASAL specimens in patients over 82 years of age), is one component of a comprehensive surveillance program.  Test performance has been validated by [facility] for patients greater than or equal to 31 year old. It is not intended to diagnose infection nor to guide or monitor treatment.   Urine culture     Status: Abnormal   Collection Time: 08/27/16 10:30 AM  Result Value Ref Range Status   Specimen Description URINE, RANDOM  Final    Special Requests NONE  Final   Culture MULTIPLE SPECIES PRESENT, SUGGEST RECOLLECTION (A)  Final   Report Status 08/28/2016 FINAL  Final    RADIOLOGY:  Ct Chest Wo Contrast  Result Date: 03/19/2018 CLINICAL DATA:  Fall, left chest pain EXAM: CT CHEST WITHOUT CONTRAST TECHNIQUE: Multidetector CT imaging of the chest was performed following the standard protocol without IV contrast. COMPARISON:  Chest two-view 07/06/2015 FINDINGS: Cardiovascular: Atherosclerotic calcification in the aorta without aneurysm. Coronary artery calcification. Heart size normal. No pericardial effusion. Mediastinum/Nodes: Subcarinal lymph node 14 x 21 mm. No other adenopathy or mass in the mediastinum. Negative for hematoma. Normal esophagus. Lungs/Pleura: Lungs are clear without infiltrate effusion or pneumothorax. Negative  for lung mass. Upper Abdomen: Atherosclerotic aorta without aneurysm. No acute abnormality in the upper abdomen. Benign calcification in the spleen. Musculoskeletal: Multiple left rib fractures. Fractures of the left 6, 7, 8, 9, and tenth ribs posterolaterally. The ninth rib fracture is displaced. No pneumothorax or effusion. IMPRESSION: Fractures left sixth, seventh, eighth, ninth, and tenth ribs posterolaterally. Negative for effusion or pneumothorax. The lungs are clear Subcarinal lymph node 14 x 21 mm.  No other enlarged nodes or mass. Aortic Atherosclerosis (ICD10-I70.0).  Coronary artery calcification Electronically Signed   By: Franchot Gallo M.D.   On: 03/19/2018 09:00    Follow up with PCP in 1 week.  Management plans discussed with the patient, family and they are in agreement.  CODE STATUS: Full code    Code Status Orders  (From admission, onward)        Start     Ordered   03/19/18 1315  Full code  Continuous     03/19/18 1314    Code Status History    Date Active Date Inactive Code Status Order ID Comments User Context   08/26/2016 2330 08/29/2016 1748 Full Code [id number]   Harvie Bridge, DO Inpatient   08/26/2016 2155 08/26/2016 2155 Full Code [id number]  Thornton Park, MD ED    Advance Directive Documentation     Most Recent Value  Type of Advance Directive  Healthcare Power of Attorney  Pre-existing out of facility DNR order (yellow form or pink MOST form)  -  "MOST" Form in Place?  -      TOTAL TIME TAKING CARE OF THIS PATIENT ON DAY OF DISCHARGE: more than 33 minutes.   Saundra Shelling M.D on 03/20/2018 at 12:26 PM  Between 7am to 6pm - Pager - [phone]  After 6pm go to [email or web address] - password [email or web address] [facility]  [facility] [place] [facility]  Office  [phone]  CC: Primary care physician; Adin Hector, MD  Note: This dictation was prepared with Dragon dictation along with smaller phrase technology. Any transcriptional errors that result from this process are unintentional.

## 2020-06-26 ENCOUNTER — Ambulatory Visit (INDEPENDENT_AMBULATORY_CARE_PROVIDER_SITE_OTHER): Payer: Medicare Other

## 2020-06-26 ENCOUNTER — Ambulatory Visit
Admission: EM | Admit: 2020-06-26 | Discharge: 2020-06-26 | Disposition: A | Discharge status: Home / Self Care | Payer: Medicare Other | Attending: Emergency Medicine | Admitting: Emergency Medicine

## 2020-06-26 ENCOUNTER — Encounter: Payer: Self-pay | Admitting: Emergency Medicine

## 2020-06-26 ENCOUNTER — Other Ambulatory Visit: Payer: Self-pay

## 2020-06-26 DIAGNOSIS — M79602 Pain in left arm: Secondary | ICD-10-CM

## 2020-06-26 DIAGNOSIS — S42492A Other displaced fracture of lower end of left humerus, initial encounter for closed fracture: Secondary | ICD-10-CM | POA: Diagnosis not present

## 2020-06-26 DIAGNOSIS — M25512 Pain in left shoulder: Secondary | ICD-10-CM

## 2020-06-26 DIAGNOSIS — W19XXXA Unspecified fall, initial encounter: Secondary | ICD-10-CM

## 2020-06-26 NOTE — Discharge Instructions (Addendum)
Call the [facility] orthopedic clinic in [place] at [phone] and get an appointment within the week.  They will refer her to [facility] if necessary.  In the meantime she is to wear the splint at all times.  You may give her 500 to 1000 mg of Tylenol 3 times a day.  Go immediately to the ER if her hand turns blue, white, cold, if she loses pulses, has pain not controlled with the Tylenol, or for any other concerns.  The medical power of attorney needs to accompany her to her follow-up orthopedic visit.

## 2020-06-26 NOTE — ED Provider Notes (Signed)
HPI  SUBJECTIVE:  Autumn Scott is a 86 y.o. female who presents with left arm pain, limitation of motion, swelling after having an unwitnessed fall last night.  Caregiver who brings her in states that she was coming from the bathroom and "must have lost her balance".  The caregiver on duty that night heard the fall, rushed immediately in, and found her getting up off of the floor.  No known head injury, headache, altered mental status, neck pain, chest pain, shortness of breath, abdominal pain, loss of consciousness.  No injury to hips or legs.  She denies arm bruising.  She can move all of her fingers, no grip weakness.  No distal numbness or tingling, color or temperature changes.  She has been ambulatory without any problem since the fall.  Caregiver gave her Tylenol this morning.  Not sure if this helped.  Symptoms are worse with arm movement.  She has a past medical history of dementia and has no recollection of the fall.  All history obtained from caretaker.  She has a history of osteoporosis, left humeral fracture, diet-controlled diabetes.  No history of hypertension, anticoagulant/antiplatelet use, atrial fibrillation, syncope, coronary artery disease.  QIO:NGEXB, Tonette Bihari, MD  Past Medical History:  Diagnosis Date  . Anxiety   . Cancer [facility])    uterine cancer therefore had a hysterectomy  . Diabetes mellitus ([place])   . High cholesterol   . Hypertension   . Hypothyroidism   . Neuropathy   . Thyroid disease     Past Surgical History:  Procedure Laterality Date  . ABDOMINAL HYSTERECTOMY    . APPENDECTOMY    . INTRAMEDULLARY (IM) NAIL INTERTROCHANTERIC Right 08/27/2016   Procedure: INTRAMEDULLARY (IM) NAIL INTERTROCHANTRIC;  Surgeon: Thornton Park, MD;  Location: [facility] ORS;  Service: Orthopedics;  Laterality: Right;    History reviewed. No pertinent family history.  Social History   Tobacco Use  . Smoking status: Former Smoker    Types: Cigarettes    Quit date: 08/19/1973     Years since quitting: 46.8  . Smokeless tobacco: Never Used  Vaping Use  . Vaping Use: Never used  Substance Use Topics  . Alcohol use: No  . Drug use: No    No current facility-administered medications for this encounter.  Current Outpatient Medications:  .  alendronate (FOSAMAX) 70 MG tablet, Take 70 mg by mouth once a week. Take with a full glass of water on an empty stomach., Disp: , Rfl:  .  gabapentin (NEURONTIN) 600 MG tablet, Take 600 mg by mouth 3 (three) times daily. , Disp: , Rfl:  .  Melatonin 1 MG TABS, Take 2 mg by mouth daily., Disp: , Rfl:  .  pioglitazone (ACTOS) 30 MG tablet, Take 30 mg by mouth daily., Disp: , Rfl:  .  simvastatin (ZOCOR) 40 MG tablet, Take 40 mg by mouth every evening. , Disp: , Rfl:  .  vitamin B-12 (CYANOCOBALAMIN) 1000 MCG tablet, Take 1,000 mcg by mouth daily., Disp: , Rfl:  .  levothyroxine (SYNTHROID, LEVOTHROID) 75 MCG tablet, Take 75 mcg by mouth daily before breakfast. , Disp: , Rfl:  .  loperamide (IMODIUM) 2 MG capsule, Take 2 mg by mouth as needed for diarrhea or loose stools., Disp: , Rfl:  .  traZODone (DESYREL) 50 MG tablet, Take 50 mg by mouth at bedtime as needed for sleep., Disp: , Rfl:  .  zinc oxide 20 % ointment, Apply 1 application topically as needed for irritation., Disp: ,  Rfl:   Allergies  Allergen Reactions  . Sulfa Antibiotics Rash     ROS  As noted in HPI.   Physical Exam  BP (!) 166/58   Pulse 64   Temp 98.3 F (36.8 C)   Resp 18   Ht 5\' 1"  (1.549 m)   Wt 48.1 kg   SpO2 100%   BMI 20.04 kg/m   Constitutional: Well developed, well nourished, no acute distress Eyes:  EOMI, conjunctiva normal bilaterally PERRLA, no photophobia HENT: Normocephalic, atraumatic,mucus membranes moist no hemotympanum. Neck: No C-spine tenderness. Respiratory: Normal inspiratory effort, lungs clear bilaterally Cardiovascular: Normal rate regular rhythm no murmurs rubs or gallops GI: nondistended skin: No rash, skin  intact Musculoskeletal: Left arm severely limited due to pain.  Tenderness distal humerus. Arm held against body with elbow flexid.  Supracondylar region tender, Radial head NT , Olecrenon process NT , Medial epicondyle NT , Lateral epicondyle NT, Shoulder NT, forearm nontender, wrist NT, Hand NT with radial pulse intact, Sensation LT and Motor intact distally in distribution of radial, median, and ulnar nerve.  Neurologic: Alert & oriented x 3, no focal neuro deficits Psychiatric: Speech and behavior appropriate   ED Course   Medications - No data to display  Orders Placed This Encounter  Procedures  . DG Humerus Left    Standing Status:   Standing    Number of Occurrences:   1    Order Specific Question:   Reason for Exam (SYMPTOM  OR DIAGNOSIS REQUIRED)    Answer:   fall, swelling and pain  . DG Shoulder Left    Standing Status:   Standing    Number of Occurrences:   1    Order Specific Question:   Reason for Exam (SYMPTOM  OR DIAGNOSIS REQUIRED)    Answer:   fall, swelling and pain  . DG Elbow Complete Left    Standing Status:   Standing    Number of Occurrences:   1    Order Specific Question:   Reason for Exam (SYMPTOM  OR DIAGNOSIS REQUIRED)    Answer:   fall, swelling and pain  . Apply other splint    Standing Status:   Standing    Number of Occurrences:   1    Order Specific Question:   Laterality    Answer:   Left    Order Specific Question:   Splint type    Answer:   coaptation splint and ace wraps    No results found for this or any previous visit (from the past 24 hour(s)). DG Elbow Complete Left  Result Date: 06/26/2020 CLINICAL DATA:  [place]. Left arm pain. EXAM: LEFT ELBOW - COMPLETE 3+ VIEW COMPARISON:  None. FINDINGS: There is an acute displaced spiral type fracture of the distal humeral shaft extending down to the supracondylar region. I do not see any definite intra-articular involvement. There is approximately 1 shaft with of medial and posterior  displacement noted. Moderate degenerative changes are noted at the elbow joint but no fracture of the radius or ulna. No definite joint effusion. IMPRESSION: Displaced spiral type fracture of the distal humeral shaft and supracondylar region. No intra-articular involvement. Electronically Signed   By: Marijo Sanes M.D.   On: 06/26/2020 11:02   DG Shoulder Left  Result Date: 06/26/2020 CLINICAL DATA:  [place]. Left shoulder pain. EXAM: LEFT SHOULDER - 2+ VIEW COMPARISON:  None. FINDINGS: The humeral head is normally located in the glenoid fossa. No acute shoulder fracture. There is  a remote healed fracture involving the proximal humeral shaft. Remote healed rib fractures are noted. No definite acute fractures. IMPRESSION: 1. Remote healed proximal humeral shaft fracture and remote healed rib fractures. 2. No acute shoulder fracture. Electronically Signed   By: Marijo Sanes M.D.   On: 06/26/2020 11:00   DG Humerus Left  Result Date: 06/26/2020 CLINICAL DATA:  [place]. Pain and swelling. EXAM: LEFT HUMERUS - 2+ VIEW COMPARISON:  None. FINDINGS: Remote healed proximal humeral shaft fracture noted. There is an acute spiral type distal humeral shaft fracture. One shaft width of medial displacement noted. This extends down to the supracondylar region but I do not see any definite involvement of the joint. Remote healed left rib fractures are noted. The visualized left lung is grossly clear. IMPRESSION: Acute spiral type distal humeral shaft fracture. Electronically Signed   By: Marijo Sanes M.D.   On: 06/26/2020 10:59    ED Clinical Impression  1. Other closed displaced fracture of distal end of left humerus, initial encounter      ED Assessment/Plan  Patient with a presumed mechanical fall, however it was unwitnessed.  Caretaker states that last night's caretaker found her getting up off of the floor, there is no subsequent confusion or altered mental status.  There was no loss of consciousness.  No  evidence of other injury, specifically head or neck injury.  Doubt syncope or seizure causing her fall as caretaker came immediately to see the patient after hearing the fall and states the patient was alert and getting up off of the floor.  Reviewed imaging independently.  Displaced spiral type fracture of the distal humeral shaft and supracondylar region.  No definite involvement of the joint.  Remote healed fracture proximal humeral shaft.  See radiology report for full details.  Patient with an acute spiral displaced distal humeral shaft fracture.  No definitive involvement of the joint.  remote healed proximal humeral shaft fracture No evidence of compartment syndrome.  motor and sensation in the median/radial/ulnar distribution.  D/w with Dr. Posey Pronto, orthopedics on-call.  Recommends coaptation splint, Tylenol, she is to call the main office to be seen this week and a referral can be placed if necessary.  She is to bring medical power of attorney with her to this visit.  Discussed] imaging, MDM, treatment plan, and plan for follow-up with caretaker discussed sn/sx that should prompt return to the ED.Caretaker agrees with plan.   No orders of the defined types were placed in this encounter.   *This clinic note was created using Dragon dictation software. Therefore, there may be occasional mistakes despite careful proofreading.   ?    Melynda Ripple, MD 06/27/20 [phone]

## 2020-06-26 NOTE — ED Triage Notes (Addendum)
Pt fell last night while going to bathroom. She has pain in her left upper ar/shoulder. She has swelling but no known bruising. Caregiver states he thinks she may have slipped on the floor due to her socks. Pt denies any other pains.

## 2021-02-25 IMAGING — CR DG ELBOW COMPLETE 3+V*L*
4 series · 4 of 4 positions shown · non-contrast
Comparison: None.

CLINICAL DATA: Fell. Left arm pain.

EXAM:
LEFT ELBOW - COMPLETE 3+ VIEW

[elbow ap]
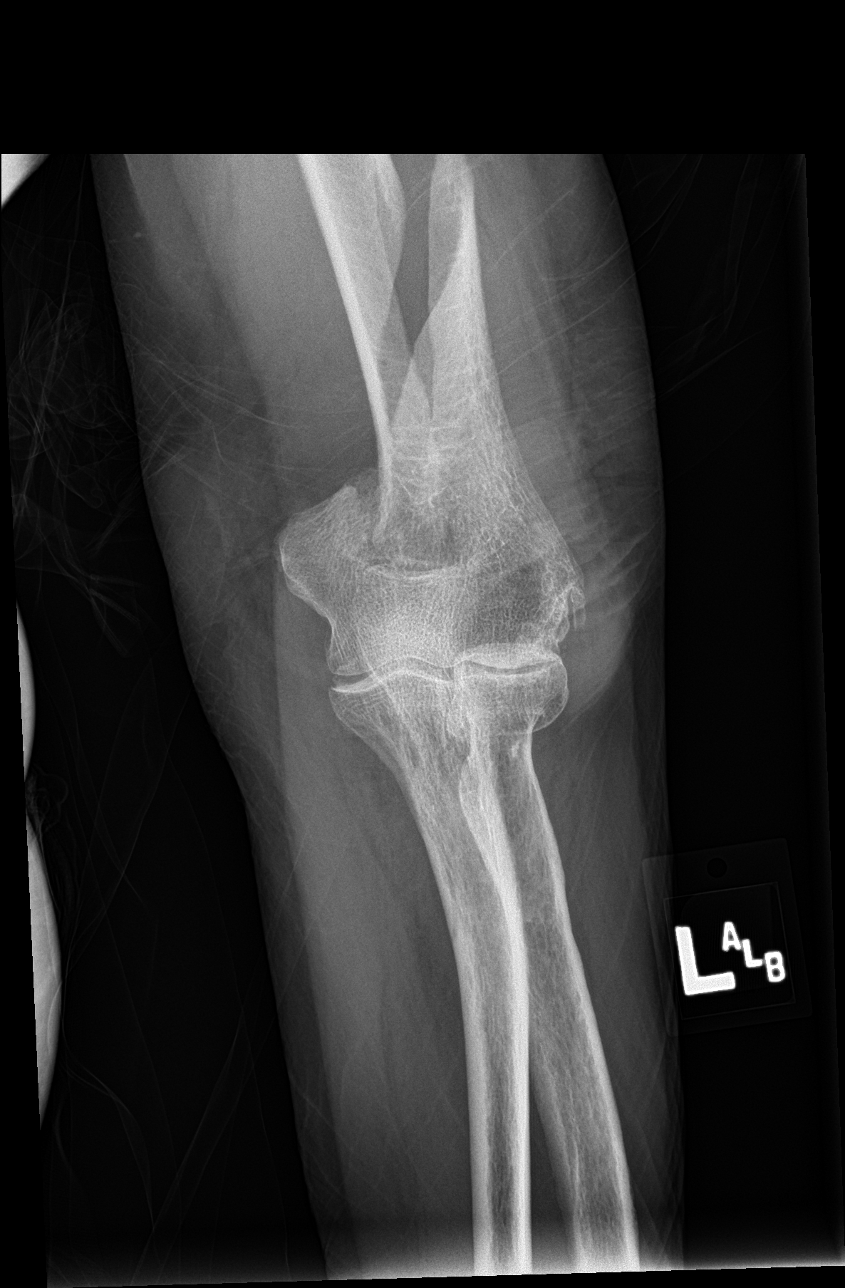

[elbow obl (1 of 2)]
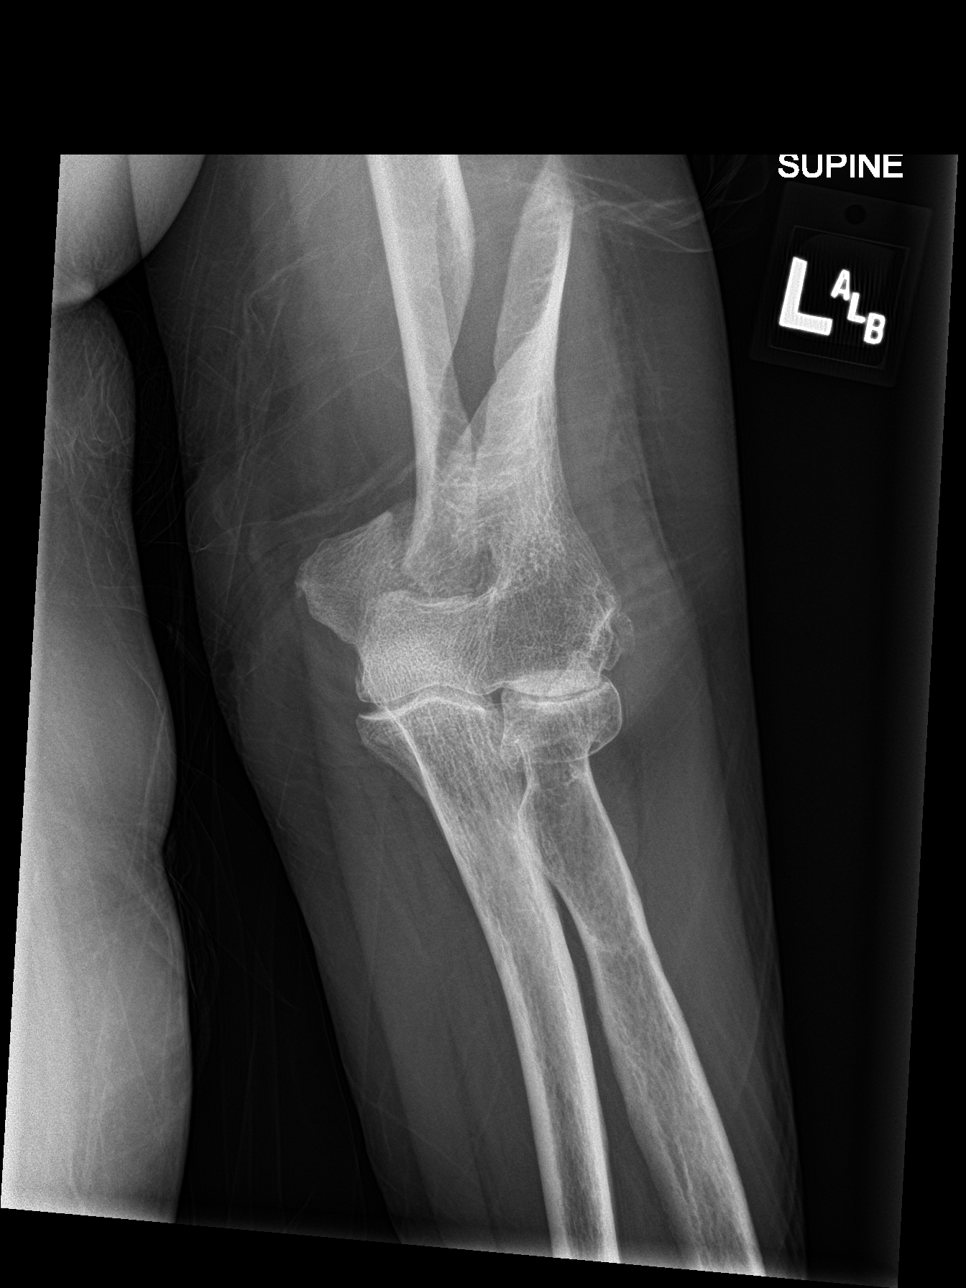

[elbow obl (2 of 2)]
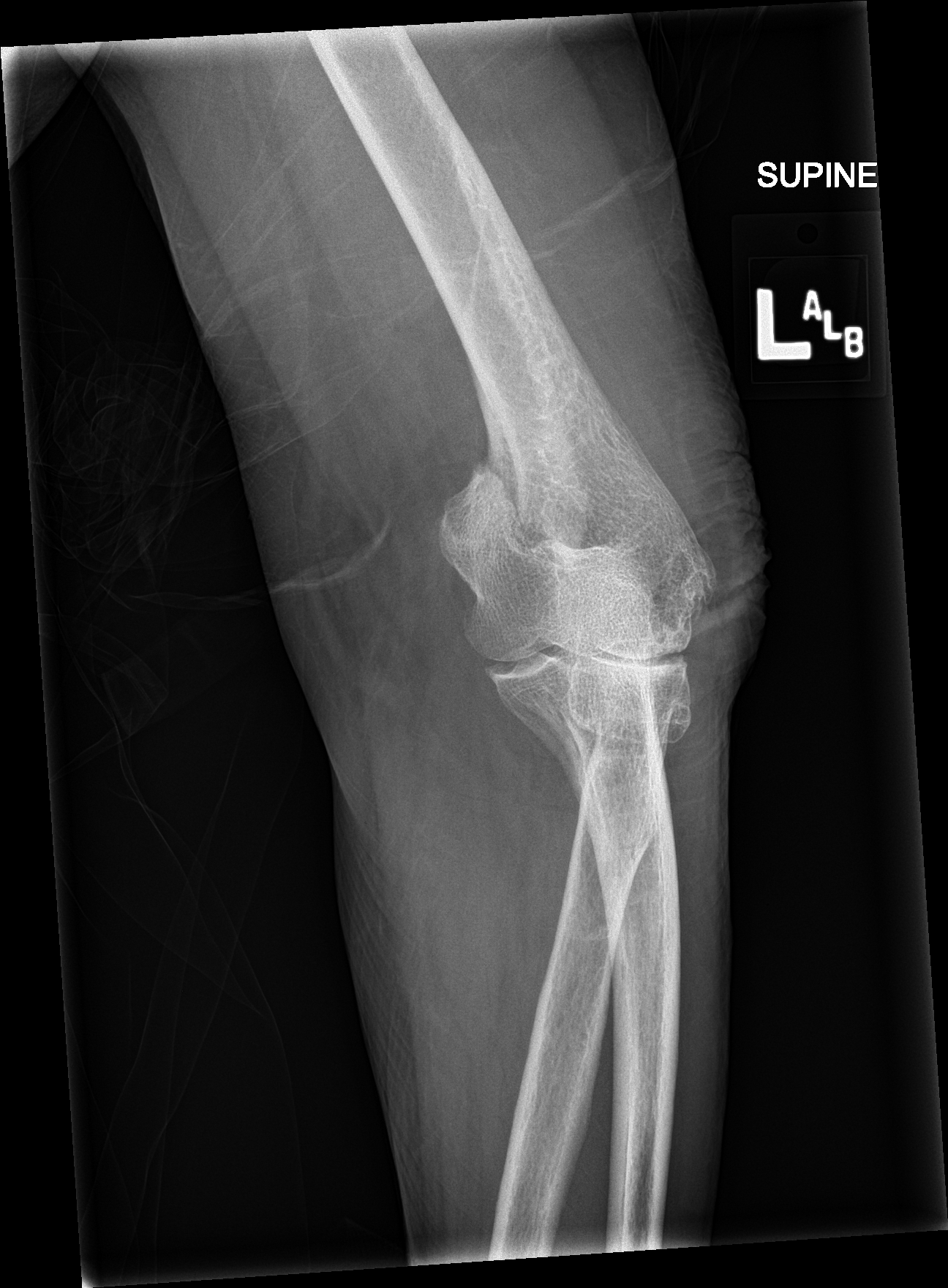

[elbow lat]
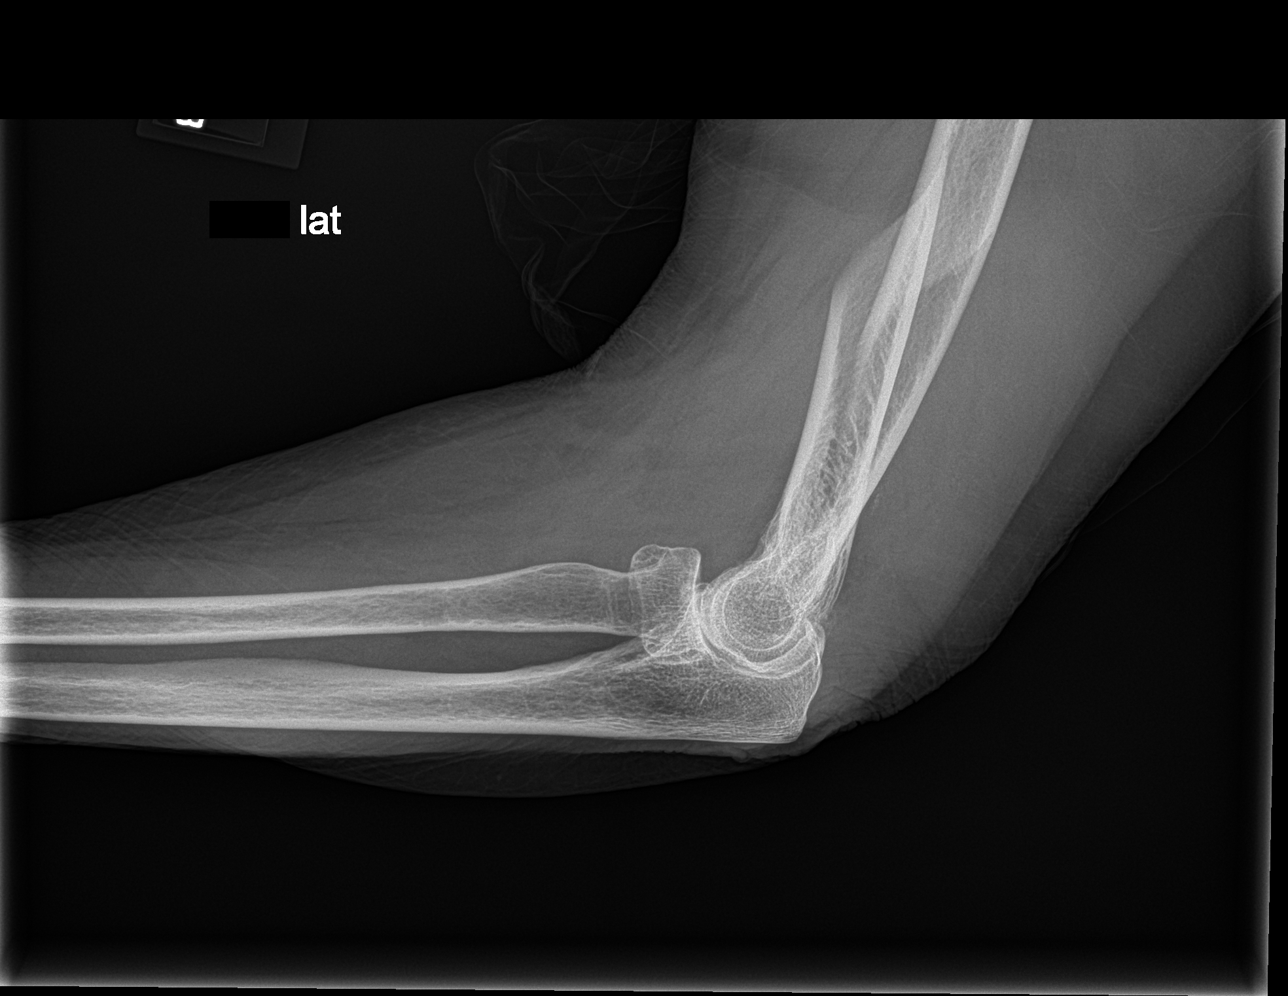

[4 of 4 positions shown; findings below may reference images not displayed]

FINDINGS: There is an acute displaced spiral type fracture of the distal
humeral shaft extending down to the supracondylar region. I do not
see any definite intra-articular involvement. There is approximately
1 shaft with of medial and posterior displacement noted.

Moderate degenerative changes are noted at the elbow joint but no
fracture of the radius or ulna. No definite joint effusion.
IMPRESSION: Displaced spiral type fracture of the distal humeral shaft and
supracondylar region. No intra-articular involvement.
